# Patient Record
Sex: Female | Born: 1980 | Hispanic: No | Marital: Married | Smoking: Never smoker
Health system: Southern US, Community
[De-identification: ages and names within clinical notes are randomized; demographics above are authoritative.]

## PROBLEM LIST (undated history)

## (undated) DIAGNOSIS — Z789 Other specified health status: Secondary | ICD-10-CM

## (undated) HISTORY — PX: NO PAST SURGERIES: SHX2092

---

## 2017-08-11 LAB — OB RESULTS CONSOLE ABO/RH: RH Type: POSITIVE

## 2017-08-11 LAB — OB RESULTS CONSOLE HEPATITIS B SURFACE ANTIGEN: Hepatitis B Surface Ag: NEGATIVE

## 2017-08-11 LAB — OB RESULTS CONSOLE ANTIBODY SCREEN: ANTIBODY SCREEN: NEGATIVE

## 2017-08-11 LAB — OB RESULTS CONSOLE RPR: RPR: NONREACTIVE

## 2017-08-11 LAB — OB RESULTS CONSOLE GC/CHLAMYDIA
CHLAMYDIA, DNA PROBE: NEGATIVE
GC PROBE AMP, GENITAL: NEGATIVE

## 2017-08-11 LAB — OB RESULTS CONSOLE RUBELLA ANTIBODY, IGM: RUBELLA: IMMUNE

## 2017-08-11 LAB — OB RESULTS CONSOLE HIV ANTIBODY (ROUTINE TESTING): HIV: NONREACTIVE

## 2017-11-03 ENCOUNTER — Inpatient Hospital Stay (HOSPITAL_COMMUNITY): Admission: AD | Admit: 2017-11-03 | Payer: Self-pay | Source: Ambulatory Visit | Admitting: Obstetrics and Gynecology

## 2017-11-04 NOTE — L&D Delivery Note (Addendum)
VAGINAL DELIVERY NOTE  37 y.o. G1P0 at 3421w1d delivered a viable female infant at 0115 in cephalic, ROA position. No nuchal cord. Left anterior shoulder delivered with ease followed by 60 sec delayed cord clamping. Cord clamped x2 and cut. Placenta delivered spontaneously intact, with 3VC. Fundus firm on exam with massage and pitocin.  Mother: Anesthesia: epidural Laceration: 2nd perineal x1 Suture repair: 3.0 mono EBL: 200 mL  Baby: Apgars: 8, 9 Weight: pending Cord pH: not sent  Good hemostasis noted.  Mom to postpartum.  Baby to couplet-care/skin-to-skin.  [name], DO, PGY-2 02/07/2018, 2:32 AM   Angelica [name] is a 37 y.o. female G1P1001 with IUP at 7421w1d admitted for SOL.  She had AROM only for augmentation and progressed to complete and pushed 1 hour, then labored down, then pushed 1.5 hours to deliver.  Cord clamping delayed by 1-3 minutes then clamped by CNM and cut by FOB.  Placenta intact and spontaneous, bleeding minimal.  Dr Despina HiddenEure called to room to evaluate possible laceration of cervix but no cervical repair needed.  Second degree perineal laceration repaired without difficulty.  Mom and baby stable prior to transfer to postpartum. She plans on breastfeeding.

## 2017-12-29 LAB — OB RESULTS CONSOLE GC/CHLAMYDIA
Chlamydia: NEGATIVE
GC PROBE AMP, GENITAL: NEGATIVE

## 2017-12-29 LAB — OB RESULTS CONSOLE GBS: STREP GROUP B AG: NEGATIVE

## 2018-02-02 ENCOUNTER — Other Ambulatory Visit (HOSPITAL_COMMUNITY): Payer: Self-pay | Admitting: Nurse Practitioner

## 2018-02-02 ENCOUNTER — Telehealth (HOSPITAL_COMMUNITY): Payer: Self-pay | Admitting: *Deleted

## 2018-02-02 DIAGNOSIS — Z3A40 40 weeks gestation of pregnancy: Secondary | ICD-10-CM

## 2018-02-02 DIAGNOSIS — O480 Post-term pregnancy: Secondary | ICD-10-CM

## 2018-02-02 NOTE — Telephone Encounter (Signed)
Preadmission screen Interpreter number [phone]

## 2018-02-04 ENCOUNTER — Encounter (HOSPITAL_COMMUNITY): Payer: Self-pay | Admitting: *Deleted

## 2018-02-05 ENCOUNTER — Ambulatory Visit (HOSPITAL_COMMUNITY)
Admission: RE | Admit: 2018-02-05 | Discharge: 2018-02-05 | Disposition: A | Discharge status: Home / Self Care | Payer: BLUE CROSS/BLUE SHIELD | Source: Ambulatory Visit | Attending: Nurse Practitioner | Admitting: Nurse Practitioner

## 2018-02-05 DIAGNOSIS — O480 Post-term pregnancy: Secondary | ICD-10-CM | POA: Insufficient documentation

## 2018-02-05 DIAGNOSIS — Z3A40 40 weeks gestation of pregnancy: Secondary | ICD-10-CM

## 2018-02-06 ENCOUNTER — Other Ambulatory Visit: Payer: Self-pay

## 2018-02-06 ENCOUNTER — Encounter (HOSPITAL_COMMUNITY): Payer: Self-pay

## 2018-02-06 ENCOUNTER — Inpatient Hospital Stay (HOSPITAL_COMMUNITY): Payer: BLUE CROSS/BLUE SHIELD | Admitting: Anesthesiology

## 2018-02-06 ENCOUNTER — Inpatient Hospital Stay (HOSPITAL_COMMUNITY)
Admission: AD | Admit: 2018-02-06 | Discharge: 2018-02-09 | DRG: 807 | Disposition: A | Discharge status: Home / Self Care | Payer: BLUE CROSS/BLUE SHIELD | Source: Ambulatory Visit | Attending: Obstetrics & Gynecology | Admitting: Obstetrics & Gynecology

## 2018-02-06 DIAGNOSIS — K5900 Constipation, unspecified: Secondary | ICD-10-CM | POA: Diagnosis present

## 2018-02-06 DIAGNOSIS — O480 Post-term pregnancy: Secondary | ICD-10-CM | POA: Diagnosis not present

## 2018-02-06 DIAGNOSIS — Z3A41 41 weeks gestation of pregnancy: Secondary | ICD-10-CM

## 2018-02-06 DIAGNOSIS — Z3483 Encounter for supervision of other normal pregnancy, third trimester: Secondary | ICD-10-CM | POA: Diagnosis present

## 2018-02-06 HISTORY — DX: Other specified health status: Z78.9

## 2018-02-06 LAB — CBC
HEMATOCRIT: 38.2 % (ref 36.0–46.0)
HEMOGLOBIN: 12.8 g/dL (ref 12.0–15.0)
MCH: 26 pg (ref 26.0–34.0)
MCHC: 33.5 g/dL (ref 30.0–36.0)
MCV: 77.5 fL — ABNORMAL LOW (ref 78.0–100.0)
Platelets: 228 10*3/uL (ref 150–400)
RBC: 4.93 MIL/uL (ref 3.87–5.11)
RDW: 16.1 % — ABNORMAL HIGH (ref 11.5–15.5)
WBC: 13.6 10*3/uL — ABNORMAL HIGH (ref 4.0–10.5)

## 2018-02-06 LAB — TYPE AND SCREEN
ABO/RH(D): O POS
ANTIBODY SCREEN: NEGATIVE

## 2018-02-06 LAB — ABO/RH: ABO/RH(D): O POS

## 2018-02-06 MED ORDER — OXYTOCIN 40 UNITS IN LACTATED RINGERS INFUSION - SIMPLE MED
2.5000 [IU]/h | INTRAVENOUS | Status: DC
  Filled 2018-02-06: qty 1000

## 2018-02-06 MED ORDER — PHENYLEPHRINE 40 MCG/ML (10ML) SYRINGE FOR IV PUSH (FOR BLOOD PRESSURE SUPPORT)
80.0000 ug | PREFILLED_SYRINGE | INTRAVENOUS | Status: DC | PRN
  Filled 2018-02-06: qty 5

## 2018-02-06 MED ORDER — LIDOCAINE HCL (PF) 1 % IJ SOLN
30.0000 mL | INTRAMUSCULAR | Status: DC | PRN
  Filled 2018-02-06: qty 30

## 2018-02-06 MED ORDER — LIDOCAINE HCL (PF) 1 % IJ SOLN
INTRAMUSCULAR | Status: DC | PRN
  Administered 2018-02-06 (×2): 6 mL via EPIDURAL

## 2018-02-06 MED ORDER — OXYCODONE-ACETAMINOPHEN 5-325 MG PO TABS: 1.0000 | ORAL_TABLET | ORAL | Status: DC | PRN

## 2018-02-06 MED ORDER — LACTATED RINGERS IV SOLN
500.0000 mL | INTRAVENOUS | Status: DC | PRN
  Administered 2018-02-06 (×2): 500 mL via INTRAVENOUS

## 2018-02-06 MED ORDER — LACTATED RINGERS IV SOLN
500.0000 mL | Freq: Once | INTRAVENOUS | Status: AC
  Administered 2018-02-07: 500 mL via INTRAVENOUS

## 2018-02-06 MED ORDER — ONDANSETRON HCL 4 MG/2ML IJ SOLN: 4.0000 mg | Freq: Four times a day (QID) | INTRAMUSCULAR | Status: DC | PRN

## 2018-02-06 MED ORDER — ACETAMINOPHEN 325 MG PO TABS: 650.0000 mg | ORAL_TABLET | ORAL | Status: DC | PRN

## 2018-02-06 MED ORDER — OXYCODONE-ACETAMINOPHEN 5-325 MG PO TABS: 2.0000 | ORAL_TABLET | ORAL | Status: DC | PRN

## 2018-02-06 MED ORDER — EPHEDRINE 5 MG/ML INJ
10.0000 mg | INTRAVENOUS | Status: DC | PRN
  Filled 2018-02-06: qty 2

## 2018-02-06 MED ORDER — PHENYLEPHRINE 40 MCG/ML (10ML) SYRINGE FOR IV PUSH (FOR BLOOD PRESSURE SUPPORT)
80.0000 ug | PREFILLED_SYRINGE | INTRAVENOUS | Status: DC | PRN
  Filled 2018-02-06: qty 10
  Filled 2018-02-06: qty 5

## 2018-02-06 MED ORDER — FENTANYL CITRATE (PF) 100 MCG/2ML IJ SOLN
100.0000 ug | INTRAMUSCULAR | Status: DC | PRN
  Administered 2018-02-06 (×2): 100 ug via INTRAVENOUS
  Filled 2018-02-06 (×2): qty 2

## 2018-02-06 MED ORDER — FENTANYL 2.5 MCG/ML BUPIVACAINE 1/10 % EPIDURAL INFUSION (WH - ANES)
14.0000 mL/h | INTRAMUSCULAR | Status: DC | PRN
  Administered 2018-02-06 (×2): 14 mL/h via EPIDURAL
  Filled 2018-02-06 (×3): qty 100

## 2018-02-06 MED ORDER — LACTATED RINGERS IV SOLN
INTRAVENOUS | Status: DC
  Administered 2018-02-06 (×2): via INTRAVENOUS

## 2018-02-06 MED ORDER — DIPHENHYDRAMINE HCL 50 MG/ML IJ SOLN: 12.5000 mg | INTRAMUSCULAR | Status: DC | PRN

## 2018-02-06 MED ORDER — SOD CITRATE-CITRIC ACID 500-334 MG/5ML PO SOLN: 30.0000 mL | ORAL | Status: DC | PRN

## 2018-02-06 MED ORDER — FLEET ENEMA 7-19 GM/118ML RE ENEM: 1.0000 | ENEMA | RECTAL | Status: DC | PRN

## 2018-02-06 MED ORDER — OXYTOCIN BOLUS FROM INFUSION
500.0000 mL | Freq: Once | INTRAVENOUS | Status: AC
  Administered 2018-02-07: 500 mL via INTRAVENOUS

## 2018-02-06 NOTE — Progress Notes (Signed)
LABOR PROGRESS NOTE  Subjective:  Patient seen and examined for progress of labor. Patient comfortable with epidural. Feels the need to push.  Objective:  Vitals:   02/06/18 1901 02/06/18 2000 02/06/18 2030 02/06/18 2100  BP: 102/76 123/71 112/76 140/76  Pulse: 88 77 89 86  Resp: 16     Temp: 99 F (37.2 C) 98.8 F (37.1 C) 99.2 F (37.3 C)   TempSrc: Oral Oral Oral   SpO2:      Weight:      Height:       Dilation: 10 Dilation Complete Date: 02/06/18 Dilation Complete Time: 2055 Effacement (%): 100 Cervical Position: Anterior Station: Plus 1 Presentation: Vertex Exam by:: Veronica Mensah FHT: 150 bpm, moderate variability, accelerations present, absent decelerations TOCO: regular, every 2-3 minutes  Assessment/Plan: Angelica [name] is a 37 y.o. G1P0 at 7652w0d presented for SOL. S/p AROM @1719 . Making regular contractions with desire to push.   Labor: stage 2 Preeclampsia: N/A Fetal wellbeing: category 1 Pain control: epidural I/D: neg Anticipated MOD: continue expectant management, anticipate SVD  Durward Parcelavid Rolin Schult, DO, PGY-2 02/06/2018, 10:09 PM

## 2018-02-06 NOTE — Anesthesia Pain Management Evaluation Note (Signed)
  CRNA Pain Management Visit Note  Patient: Angelica [name], 37 y.o., female  "Hello I am a member of the anesthesia team at [facility]. We have an anesthesia team available at all times to provide care throughout the hospital, including epidural management and anesthesia for C-section. I don't know your plan for the delivery whether it a natural birth, water birth, IV sedation, nitrous supplementation, doula or epidural, but we want to meet your pain goals."   1.Was your pain managed to your expectations on prior hospitalizations?   No prior hospitalizations  2.What is your expectation for pain management during this hospitalization?     Labor support without medications  3.How can we help you reach that goal? Support prn  Record the patient's initial score and the patient's pain goal.   Pain: 3  Pain Goal: 9   Interview conducted by bedside RN via language line.  RN reported results of interview to CRNA.  [facility] wants you to be able to say your pain was always managed very well.  [facility] Center[facility]LE,Angelica [name] 02/06/2018

## 2018-02-06 NOTE — H&P (Signed)
OBSTETRIC ADMISSION HISTORY AND PHYSICAL  Angelica [name] is a 37 y.o. female G1P0 with IUP at [redacted]w[redacted]d by LMP presenting for spontaneous onset of labor. She reports +FMs, No LOF, no VB, no blurry vision, headaches or peripheral edema, and RUQ pain.  She plans on breast feeding. She is unsure what she desires for birth control. She received her prenatal care at [facility]   Dating: By LMP --->  Estimated Date of Delivery: 01/30/18  Prenatal History/Complications:  Past Medical History: History reviewed. No pertinent past medical history.  Past Surgical History: History reviewed. No pertinent surgical history.  Obstetrical History: OB History    Gravida  1   Para      Term      Preterm      AB      Living  0     SAB      TAB      Ectopic      Multiple      Live Births              Social History: Social History   Socioeconomic History  . Marital status: Married    Spouse name: Not on file  . Number of children: Not on file  . Years of education: Not on file  . Highest education level: Not on file  Occupational History  . Not on file  Social Needs  . Financial resource strain: Not on file  . Food insecurity:    Worry: Not on file    Inability: Not on file  . Transportation needs:    Medical: Not on file    Non-medical: Not on file  Tobacco Use  . Smoking status: Never Smoker  . Smokeless tobacco: Never Used  Substance and Sexual Activity  . Alcohol use: Never    Frequency: Never  . Drug use: Never  . Sexual activity: Not on file  Lifestyle  . Physical activity:    Days per week: Not on file    Minutes per session: Not on file  . Stress: Not on file  Relationships  . Social connections:    Talks on phone: Not on file    Gets together: Not on file    Attends religious service: Not on file    Active member of club or organization: Not on file    Attends meetings of clubs or organizations: Not on file    Relationship status: Not on file  Other Topics  Concern  . Not on file  Social History Narrative  . Not on file    Family History: History reviewed. No pertinent family history.  Allergies: No Known Allergies  Medications Prior to Admission  Medication Sig Dispense Refill Last Dose  . Prenatal Vit-Fe Fumarate-FA (PRENATAL VITAMIN PO) Take by mouth.      Review of Systems   All systems reviewed and negative except as stated in HPI  Blood pressure 118/81, pulse 94, temperature 98.5 F (36.9 C), temperature source Oral, resp. rate 16, weight 131 lb 12 oz (59.8 kg), SpO2 98 %. General appearance: alert, cooperative and no distress Lungs: clear to auscultation bilaterally Heart: regular rate and rhythm Abdomen: soft, non-tender; bowel sounds normal Pelvic: n/a Extremities: Homans sign is negative, no sign of DVT DTR's +2 Presentation: cephalic Fetal monitoringBaseline: 135 bpm, Variability: Good {> 6 bpm), Accelerations: Non-reactive but appropriate for gestational age and Decelerations: Early Uterine activityFrequency: Every 3-4 minutes Dilation: 5 Effacement (%): 100 Station: -2 Exam by:: jolynn  Prenatal labs:  ABO, Rh: O/Positive/-- (10/08 0000) Antibody: Negative (10/08 0000) Rubella: Immune (10/08 0000) RPR: Nonreactive (10/08 0000)  HBsAg: Negative (10/08 0000)  HIV: Non-reactive (10/08 0000)  GBS: Negative (02/25 0000)   Prenatal Transfer Tool  Maternal Diabetes: No Genetic Screening: Normal Maternal Ultrasounds/Referrals: Normal Fetal Ultrasounds or other Referrals:  None Maternal Substance Abuse:  No Significant Maternal Medications:  None Significant Maternal Lab Results: Lab values include: Group B Strep negative  No results found for this or any previous visit (from the past 24 hour(s)).  There are no active problems to display for this patient.   Assessment/Plan:  [name] is a 37 y.o. G1P0 at 4944w0d here for spontaneous onset of labor  #Labor: Expectant management #Pain: Per patient  request #FWB: Cat 1 #ID:  GBS neg #MOF: Breast #MOC: unsure #Circ:  outpatient  Rolm Bookbinderaroline M Neill, CNM  02/06/2018, 10:49 AM

## 2018-02-06 NOTE — Anesthesia Preprocedure Evaluation (Signed)
Anesthesia Evaluation  Patient identified by MRN, date of birth, ID band Patient awake    Reviewed: Allergy & Precautions, NPO status , Patient's Chart, lab work & pertinent test results  Airway Mallampati: I       Dental no notable dental hx. (+) Teeth Intact   Pulmonary neg pulmonary ROS,    Pulmonary exam normal breath sounds clear to auscultation       Cardiovascular negative cardio ROS Normal cardiovascular exam Rhythm:Regular Rate:Normal     Neuro/Psych negative neurological ROS  negative psych ROS   GI/Hepatic negative GI ROS, Neg liver ROS,   Endo/Other  negative endocrine ROS  Renal/GU negative Renal ROS  negative genitourinary   Musculoskeletal negative musculoskeletal ROS (+)   Abdominal Normal abdominal exam  (+)   Peds  Hematology negative hematology ROS (+)   Anesthesia Other Findings   Reproductive/Obstetrics negative OB ROS                             Anesthesia Physical Anesthesia Plan  ASA: II  Anesthesia Plan: Epidural   Post-op Pain Management:    Induction:   PONV Risk Score and Plan:   Airway Management Planned:   Additional Equipment:   Intra-op Plan:   Post-operative Plan:   Informed Consent: I have reviewed the patients History and Physical, chart, labs and discussed the procedure including the risks, benefits and alternatives for the proposed anesthesia with the patient or authorized representative who has indicated his/her understanding and acceptance.     Plan Discussed with:   Anesthesia Plan Comments:         Anesthesia Quick Evaluation

## 2018-02-06 NOTE — MAU Note (Signed)
Started having pains after visit yesterday, coming about every 15 min, not getting any stronger.little bit of blood.  For induction tomorrow

## 2018-02-06 NOTE — Anesthesia Procedure Notes (Signed)
Epidural Patient location during procedure: OB Start time: 02/06/2018 4:31 PM End time: 02/06/2018 4:35 PM  Staffing Anesthesiologist: Leilani AbleHatchett, Brytani Voth, MD Performed: anesthesiologist   Preanesthetic Checklist Completed: patient identified, site marked, surgical consent, pre-op evaluation, timeout performed, IV checked, risks and benefits discussed and monitors and equipment checked  Epidural Patient position: sitting Prep: site prepped and draped and DuraPrep Patient monitoring: continuous pulse ox and blood pressure Approach: midline Location: L3-L4 Injection technique: LOR air  Needle:  Needle type: Tuohy  Needle gauge: 17 G Needle length: 9 cm and 9 Needle insertion depth: 5 cm cm Catheter type: closed end flexible Catheter size: 19 Gauge Catheter at skin depth: 10 cm Test dose: negative and Other  Assessment Sensory level: T9 Events: blood not aspirated, injection not painful, no injection resistance, negative IV test and no paresthesia  Additional Notes Reason for block:procedure for pain

## 2018-02-06 NOTE — Progress Notes (Signed)
Labor Progress Note Angelica [name] is a 37 y.o. G1P0 at 6486w0d presented for spontaneous onset of labor  S:  Patient comfortable with epidural  O:  BP 116/69   Pulse 71   Temp 97.9 F (36.6 C) (Oral)   Resp 16   Ht 5' (1.524 m)   Wt 131 lb 12 oz (59.8 kg)   SpO2 98%   BMI 25.73 kg/m   Fetal Tracing:  Baseline: 145 Variability: moderate Accels: none Decels: none  Toco: 2-3  CVE: Dilation: 6.5 Effacement (%): 90 Cervical Position: Middle Station: -1 Presentation: Vertex Exam by:: Lima, rn   A&P: 37 y.o. G1P0 5586w0d spontaneous onset of labor #Labor: discussed with patient AROM for labor augmentation. Patient agreeable to plan of care. AROM with moderate amount of meconium stained fluid.  #Pain: epidural #FWB: Cat 1 #GBS negative  Rolm Bookbinderaroline M Belem Hintze, CNM 5:23 PM

## 2018-02-07 ENCOUNTER — Inpatient Hospital Stay (HOSPITAL_COMMUNITY): Admission: RE | Admit: 2018-02-07 | Payer: BLUE CROSS/BLUE SHIELD | Source: Ambulatory Visit

## 2018-02-07 ENCOUNTER — Encounter (HOSPITAL_COMMUNITY): Payer: Self-pay | Admitting: General Practice

## 2018-02-07 DIAGNOSIS — O480 Post-term pregnancy: Secondary | ICD-10-CM

## 2018-02-07 DIAGNOSIS — Z3A41 41 weeks gestation of pregnancy: Secondary | ICD-10-CM

## 2018-02-07 MED ORDER — SENNOSIDES-DOCUSATE SODIUM 8.6-50 MG PO TABS
2.0000 | ORAL_TABLET | ORAL | Status: DC
  Administered 2018-02-08 (×2): 2 via ORAL
  Filled 2018-02-07 (×2): qty 2

## 2018-02-07 MED ORDER — ACETAMINOPHEN 325 MG PO TABS
650.0000 mg | ORAL_TABLET | ORAL | Status: DC | PRN
  Administered 2018-02-09: 650 mg via ORAL
  Filled 2018-02-07: qty 2

## 2018-02-07 MED ORDER — WITCH HAZEL-GLYCERIN EX PADS: 1.0000 "application " | MEDICATED_PAD | CUTANEOUS | Status: DC | PRN

## 2018-02-07 MED ORDER — ONDANSETRON HCL 4 MG PO TABS: 4.0000 mg | ORAL_TABLET | ORAL | Status: DC | PRN

## 2018-02-07 MED ORDER — SIMETHICONE 80 MG PO CHEW
80.0000 mg | CHEWABLE_TABLET | ORAL | Status: DC | PRN
Start: 2018-02-07 — End: 2018-02-09

## 2018-02-07 MED ORDER — ONDANSETRON HCL 4 MG/2ML IJ SOLN: 4.0000 mg | INTRAMUSCULAR | Status: DC | PRN

## 2018-02-07 MED ORDER — IBUPROFEN 600 MG PO TABS
600.0000 mg | ORAL_TABLET | Freq: Four times a day (QID) | ORAL | Status: DC
  Administered 2018-02-07 – 2018-02-09 (×10): 600 mg via ORAL
  Filled 2018-02-07 (×11): qty 1

## 2018-02-07 MED ORDER — BENZOCAINE-MENTHOL 20-0.5 % EX AERO
1.0000 "application " | INHALATION_SPRAY | CUTANEOUS | Status: DC | PRN
  Administered 2018-02-07: 1 via TOPICAL
  Filled 2018-02-07: qty 56

## 2018-02-07 MED ORDER — DIBUCAINE 1 % RE OINT: 1.0000 "application " | TOPICAL_OINTMENT | RECTAL | Status: DC | PRN

## 2018-02-07 MED ORDER — TETANUS-DIPHTH-ACELL PERTUSSIS 5-2.5-18.5 LF-MCG/0.5 IM SUSP: 0.5000 mL | Freq: Once | INTRAMUSCULAR | Status: DC

## 2018-02-07 MED ORDER — PRENATAL MULTIVITAMIN CH
1.0000 | ORAL_TABLET | Freq: Every day | ORAL | Status: DC
  Administered 2018-02-07 – 2018-02-09 (×3): 1 via ORAL
  Filled 2018-02-07 (×3): qty 1

## 2018-02-07 MED ORDER — COCONUT OIL OIL: 1.0000 "application " | TOPICAL_OIL | Status: DC | PRN

## 2018-02-07 MED ORDER — DIPHENHYDRAMINE HCL 25 MG PO CAPS: 25.0000 mg | ORAL_CAPSULE | Freq: Four times a day (QID) | ORAL | Status: DC | PRN

## 2018-02-07 MED ORDER — LIDOCAINE-EPINEPHRINE (PF) 2 %-1:200000 IJ SOLN
INTRAMUSCULAR | Status: DC | PRN
  Administered 2018-02-07: 5 mL via EPIDURAL

## 2018-02-07 MED ORDER — ZOLPIDEM TARTRATE 5 MG PO TABS: 5.0000 mg | ORAL_TABLET | Freq: Every evening | ORAL | Status: DC | PRN

## 2018-02-07 NOTE — Lactation Note (Signed)
This note was copied from a baby's chart. Lactation Consultation Note [employer] Interpreter used mary [phone]#[id number]. Mom has slight different dialect  From interpreter and will ask to repeat if doesn't understand.  Baby 2 hrs old. Unable to latch d/t inverted non-compressible breast. Round breast tight. Breast massage and reverse pressure around areola helpful. Hand expression taught. Needed several minutes of hand expression to see thick white colostrum in center of inverted nipple. Unable to roll nipple w/finger tips. Hand pump to evert nipple and define boards.  Shells given to wear in bra this morning.  Baby isn't able to latch w/o NS. Fitted #16 NS.   Mom needed full assistance in BF. Baby BF well for 5 minutes. Noted slight dimpling. Encouraged mom to keep baby cheeks to breast. Mom needed assistance doing this. Support placed under hand and baby's head.  FOB at bedside teaching as well. Demonstrated burping, body alignment, positioning, and charting I&O. Mom has long natural finger nails, stressed importance of cutting them. Demonstrated unlatching. Newborn feeding habits, I&O, STS, cluster feeding, supply and demand discussed.Mom encouraged to feed baby 8-12 times/24 hours and with feeding cues.   Mom encouraged to waken baby for feeds if hasn't cued in 3 hrs. Mom and FOB attentive. Encouraged to call for assitance or questions. WH/LC brochure given w/resources, support groups and LC services.  Patient Name: Boy Alyona Hartis Today's Date: 02/07/2018 Reason for consult: Initial assessment   Maternal Data Has patient been taught Hand Expression?: Yes Does the patient have breastfeeding experience prior to this delivery?: No  Feeding Feeding Type: Breast Fed Length of feed: 5 min  LATCH Score Latch: Repeated attempts needed to sustain latch, nipple held in mouth throughout feeding, stimulation needed to elicit sucking reflex.  Audible Swallowing: A few with stimulation  Type of Nipple:  Inverted  Comfort (Breast/Nipple): Soft / non-tender  Hold (Positioning): Full assist, staff holds infant at breast  LATCH Score: 4  Interventions Interventions: Breast feeding basics reviewed;Support pillows;Assisted with latch;Position options;Skin to skin;Expressed milk;Breast massage;Hand express;Shells;Pre-pump if needed;Hand pump;Reverse pressure;Breast compression;Adjust position  Lactation Tools Discussed/Used Tools: Shells;Pump;Nipple Shields Nipple shield size: 16 Shell Type: Inverted Breast pump type: Manual WIC Program: Yes Pump Review: Setup, frequency, and cleaning;Milk Storage Initiated by:: Peri JeffersonL. Karlynn Furrow RN IBCLC Date initiated:: 02/07/18   Consult Status Consult Status: Follow-up Date: 02/07/18 Follow-up type: In-patient    Charyl DancerCARVER, Ly Wass G 02/07/2018, 3:44 AM

## 2018-02-07 NOTE — Anesthesia Postprocedure Evaluation (Signed)
Anesthesia Post Note  Patient: Angelica [name]  Procedure(s) Performed: AN AD HOC LABOR EPIDURAL     Patient location during evaluation: Mother Baby Anesthesia Type: Epidural Level of consciousness: awake and alert Pain management: pain level controlled Vital Signs Assessment: post-procedure vital signs reviewed and stable Respiratory status: spontaneous breathing, nonlabored ventilation and respiratory function stable Cardiovascular status: stable Postop Assessment: no headache, no backache and epidural receding Anesthetic complications: no    Last Vitals:  Vitals:   02/07/18 0425 02/07/18 0539  BP: 118/70 112/71  Pulse: 66 67  Resp: 18 18  Temp: 37.2 C 37.2 C  SpO2: 99%     Last Pain:  Vitals:   02/07/18 0541  TempSrc:   PainSc: 4    Pain Goal: Patients Stated Pain Goal: 2 (02/06/18 2045)               Angelica [name],Angelica [name]

## 2018-02-07 NOTE — Consult Note (Signed)
[facility]  Delivery Note:  SVD    02/07/2018  1:15 AM  I was called to the delivery room at the [facility] of the patient's obstetrician (Dr. McMullen) for MSAF and absent variability.  PRENATAL HX:  This is a 36 y/o G1P0 at 41 and 1/[redacted] weeks gestation who was admitted yesterday in spontaneous labor.  She is GBS negative with AROM 8 hours.    DELIVERY:  Cord clamping delayed ~ 2 minutes.  Infant was vigorous at delivery, requiring no resuscitation other than standard warming, drying and stimulation.  A pulse oximeter was applied and O2 saturations in mid 90s by 5 minutes.  APGARs 8 and 9.  Exam within normal limits.  After 5 minutes, baby left with nurse to assist parents with skin-to-skin care.   _____________________ Electronically Signed By: Lenise Jr, MD Neonatologist  

## 2018-02-08 LAB — RPR: RPR Ser Ql: REACTIVE — AB

## 2018-02-08 LAB — RPR, QUANT+TP ABS (REFLEX): T Pallidum Abs: NEGATIVE

## 2018-02-08 MED ORDER — POLYETHYLENE GLYCOL 3350 17 G PO PACK
17.0000 g | PACK | Freq: Every day | ORAL | Status: DC
  Administered 2018-02-09: 17 g via ORAL
  Filled 2018-02-08 (×3): qty 1

## 2018-02-08 NOTE — Progress Notes (Signed)
POSTPARTUM PROGRESS NOTE  Post Partum Day 1  Subjective:  Angelica [name] is a 37 y.o. G1P1001 s/p SVD at 2625w1d.  No acute events overnight.  Pt denies problems with ambulating, voiding or po intake.  She denies nausea or vomiting.  Pain is well controlled.  Reports difficulty having BM. Denies any other concerns. Bleeding is controlled.   Objective: Blood pressure 94/71, pulse 74, temperature 98.7 F (37.1 C), temperature source Oral, resp. rate 18, height 5' (1.524 m), weight 131 lb 12 oz (59.8 kg), SpO2 96 %, unknown if currently breastfeeding.  Physical Exam:  General: alert, cooperative and no distress Chest: no respiratory distress Heart:regular rate, distal pulses intact Abdomen: soft, nontender,  Uterine Fundus: firm, appropriately tender DVT Evaluation: No calf swelling or tenderness Extremities: no  edema Skin: warm, dry  Recent Labs    02/06/18 1059  HGB 12.8  HCT 38.2    Assessment/Plan: Angelica [name] is a 37 y.o. G1P1001 s/p SVD at 6425w1d   PPD#1: Routine pp care Constipation: Miralax daily Contraception: NFP Feeding: breast Dispo: Plan for discharge tomorrow.   LOS: 2 days   Kandra NicolasJulie P DegeleMD 02/08/2018, 8:55 AM

## 2018-02-08 NOTE — Progress Notes (Signed)
CSW used Secretary/administratorLanguage Line Interpreter Services for this call. Interpreter ID # [phone], Evette CristalShin assisted CSW with contacting patient. CSW received consult for a [place] speaking patient, a dialect of Vietmanese. CSW and interpreter attempted to reach patient on her personal cell phone but did not receive an answer. CSW and interpreter spoke with patient via hospital room phone. CSW inquired about patient's wellbeing since delivery, patient is hurting whenever she attempts to move. CSW inquired if patient had spoken to her nurse regarding pain, stated yes but had not been given anything today. CSW inquired about car seat and crib for infant, patient reports having both in place. Patient reports that she is formula feeding. CSW educated [facility] about [facility] and how to access that benefit. CSW will speak with RN to get patient that information. This is the patient's first baby, stating great support from father of the baby. Patient states she has a safe living environment. Patient reported having a great mood since delivery, very happy. Patient reports being severely constipated since giving birth, CSW will speak with RN to communicate needs. CSW encouraged patient to reach out for assistance with her nurses or other staff if necessary, expressed agreement and gratitude.   CSW spoke with Everardo AllSarah Riffey, RN to make her aware of patient needs.  Edwin Dadaarol Danika Kluender, MSW, LCSW-A Clinical Social Worker [facility] [phone]

## 2018-02-09 MED ORDER — IBUPROFEN 600 MG PO TABS: 600.0000 mg | ORAL_TABLET | Freq: Four times a day (QID) | ORAL | 0 refills | Status: AC

## 2018-02-09 NOTE — Lactation Note (Signed)
This note was copied from a baby's chart. Lactation Consultation Note FOB translated. Reported baby poor feeder, was on TPT and has been d/c'd.  Mom engorged. Pump w/DEBP and icing. LC massaged breast, demonstrated to FOB to massage breast while mom pumping. Mom pumped 30ml has been pumping 50 min. Instructed to pump only 20 min. Laid mom fla in bed w/cabbage leaves to breast w/burp cloth over breast ice for 20 min. To rotate around breast in 5 min. Increments. Mom is to cont. To wear cabbage until cabbage very soft w/milk. Mom is to rest then pump in 2 hrs to soften, then BF baby.  FOB giving BM in personal bottle. Encouraged not to supplement w/formula since mom has all of this BM. Need to put the baby to breast, then post-pump to soften breast. Encouraged to massage while BF at intervals and while pumping. ICE if needed. Re-apply cabbage after BF if still engorged.    Patient Name: Angelica [name]: 02/09/2018 Reason for consult: Follow-up assessment;Engorgement   Maternal Data    Feeding Feeding Type: Breast Milk Nipple Type: Slow - flow  LATCH Score          Comfort (Breast/Nipple): Engorged, cracked, bleeding, large blisters, severe discomfort        Interventions Interventions: DEBP;Ice;Breast massage  Lactation Tools Discussed/Used Breast pump type: Double-Electric Breast Pump   Consult Status Consult Status: Follow-up [name]: 02/09/18 Follow-up type: In-patient    Taeshawn Helfman, Diamond NickelLAURA G 02/09/2018, 1:18 AM

## 2018-02-09 NOTE — Progress Notes (Signed)
[employer] interpreter [phone] is used to review infant and maternal care and safety and maternal discharge teaching, when to call the doctor, medications and to make a follow up app't. Patient and family state understanding information.

## 2018-02-09 NOTE — Discharge Summary (Signed)
OB Discharge Summary     Patient Name: Angelica [name] DOB: [birth year] MRN: [id number]  Date of admission: 02/06/2018 Delivering MD: Wendee Beavers   Date of discharge: 02/09/2018  Admitting diagnosis: 41WKS, PAINS Intrauterine pregnancy: [redacted]w[redacted]d     Secondary diagnosis:  Active Problems:   Normal labor and delivery  Additional problems: language barrier     Discharge diagnosis: Term Pregnancy Delivered                                                                                                Post partum procedures:n/a  Augmentation: AROM  Complications: None  Hospital course:  Onset of Labor With Vaginal Delivery     37 y.o. yo G1P1001 at [redacted]w[redacted]d was admitted in Active Labor on 02/06/2018. Patient had an uncomplicated labor course as follows:  Membrane Rupture Time/Date: 5:19 PM ,02/06/2018   Intrapartum Procedures: Episiotomy:                                           Lacerations:  2nd degree [3];Perineal [11]  Patient had a delivery of a Viable infant. 02/07/2018  Information for the patient's newborn:  Anouk, Critzer [[id number]]  Delivery Method: Vaginal, Spontaneous(Filed from Delivery Summary)    Pateint had an uncomplicated postpartum course.  She is ambulating, tolerating a regular diet, passing flatus, and urinating well. Patient is discharged home in stable condition on 02/10/18.   Physical exam  Vitals:   02/07/18 1822 02/08/18 0626 02/08/18 1850 02/09/18 0612  BP: 116/66 94/71 111/70   Pulse: 83 74 80 87  Resp: 16 18 16    Temp: 98.9 F (37.2 C) 98.7 F (37.1 C) 98.5 F (36.9 C) 98.9 F (37.2 C)  TempSrc: Oral Oral Oral Oral  SpO2:  96%  97%  Weight:      Height:       General: alert, cooperative and no distress Lochia: appropriate Uterine Fundus: firm Incision: Healing well with no significant drainage, No significant erythema DVT Evaluation: No evidence of DVT seen on physical exam. Negative Homan's sign. No cords or calf tenderness. No significant  calf/ankle edema. Labs: Lab Results  Component Value Date   WBC 13.6 (H) 02/06/2018   HGB 12.8 02/06/2018   HCT 38.2 02/06/2018   MCV 77.5 (L) 02/06/2018   PLT 228 02/06/2018   No flowsheet data found.  Discharge instruction: per After Visit Summary and "Baby and Me Booklet".  After visit meds:  Allergies as of 02/09/2018   No Known Allergies     Medication List    TAKE these medications   ibuprofen 600 MG tablet Commonly known as:  ADVIL,MOTRIN Take 1 tablet (600 mg total) by mouth every 6 (six) hours.   PRENATAL VITAMIN PO Take by mouth.       Diet: routine diet  Activity: Advance as tolerated. Pelvic rest for 6 weeks.   Outpatient follow up:6 weeks Follow up Appt:No future appointments. Follow up Visit:No follow-ups on file.  Postpartum contraception: None  Newborn Data: Live born female  Birth Weight: 7 lb 3.7 oz (3280 g) APGAR: 8, 9  Newborn Delivery   Birth date/time:  02/07/2018 01:15:00 Delivery type:  Vaginal, Spontaneous     Baby Feeding: Breast Disposition:home with mother   02/09/2018 Donette LarryMelanie Nabilah Davoli, CNM

## 2018-02-10 ENCOUNTER — Ambulatory Visit: Payer: Self-pay

## 2018-02-10 NOTE — Lactation Note (Signed)
This note was copied from a baby's chart. Lactation Consultation Note Baby 4671 hrs old. Used [employer] interpreter [phone]#[id number] Kenney Housemananya. Noted in I&O charting baby had been getting formula all day. Mom states she is giving BM after she pumps. Mom stated that she gives formula to see how much the baby gets. Encouraged mom to feel her breast when baby starts to BF and feel her breast when the baby finishes to assess for transfer. Mom states breast is softer after BF. Mom using DEBP when LC entered rm. Baby sleeping. Mom pumped 45 ml. Breast softer mom stated but cont. To have some knots and tightness. Encouraged mom to rest flat w/ice then pump again in an hour, massage breast as pumping. BF baby for next feeding, supplement after feeding if needed. Baby shouldn't need to be supplemented after BF if getting transfer.  Language barrier, trying to figure out if mom is pumping and giving BM and how much. Encouraged mom to BF every time baby wants to, but at least every 2-3 hrs. Mom needs to get engorgement under control. LC stresses importance of emptying ( softening) breast. Reviewed milk storage again.  Mom has #16 NS. Nipples increased d/t pumping. LC gave #20NS. Mom stated she has seen milk in NS. Mom was pumping into smaller bottles, encouraged to use bigger bottles for next pumping. Encouraged to document how much BM given. Encouraged no more formula.  Patient Name: Boy Royetta CarHnga Schenk EAVWU'JToday's Date: 02/10/2018 Reason for consult: Follow-up assessment;Engorgement   Maternal Data    Feeding    LATCH Score       Type of Nipple: Everted at rest and after stimulation  Comfort (Breast/Nipple): Engorged, cracked, bleeding, large blisters, severe discomfort        Interventions Interventions: Breast massage;Breast compression;Ice;DEBP  Lactation Tools Discussed/Used Tools: Pump;Nipple Shields Nipple shield size: 20 Shell Type: Inverted Breast pump type: Double-Electric Breast Pump Pump Review: Milk  Storage   Consult Status Consult Status: Follow-up Date: 02/10/18 Follow-up type: In-patient    Charyl DancerCARVER, Raphael Fitzpatrick G 02/10/2018, 12:31 AM

## 2018-02-10 NOTE — Lactation Note (Signed)
This note was copied from a baby's chart. Lactation Consultation Note  Patient Name: Angelica [name] Today's Date: 02/10/2018   Multiple attempts were made to obtain an interpreter that spoke [place] or [place] but were unsuccessful. Although Dad is not comfortable with [place], he said Mom could speak it better than him. [place] interpreter, Mardene Celeste, used with mobile video interpreter.   Mom mildly engorged, I assisted Mom in pumping to soften breasts some. Size 24 flanges are appropriate for this Mom at this time. Mom still uses a nipple shield when putting infant at breast. I was unable to observe feeding b/c infant had recently been fed a bottle of EBM (Mom had said she did not want to put baby at breast at that time). Mom is interested in having an outpatient lactation appt. I sent a request for lactation appt to be made.   Considerable education done via interpreter. Mom was also placed in shells and encouraged to wear them to promote leaking/prevent engorgement.    Dad had bought a Medela DEBP at home. Mom was also shown how to assemble & use hand pump that was included in pump kit.   Matthias Hughs [facility] 02/10/2018, 4:07 PM

## 2018-02-24 ENCOUNTER — Ambulatory Visit: Payer: Self-pay

## 2018-02-24 NOTE — Lactation Note (Signed)
This note was copied from a baby's chart. 02/24/2018  Name: Angelica [name] MRN: [id number] Date of Birth: 02/07/2016 Gestational Age: Gestational Age: 735w1d Birth Weight: 115.7 oz Weight today:    8 pounds 2.4 ounces (3698 grams) with clean newborn diaper  Angelica [name] has gained 168 grams in the last 5 days with an average daily weight gain of 34 grams a day.  Infant is here today with mom and dad. Interpreter [employer] Interpreter, Donavan BurnetWeir Siu here for consult.   Mom reports she is BF infant on demand about every 2-3 hours. She uses both breasts with each feeding. She is not sure how long infant is feeding. Parents are keeping feeding log. Infant is voiding and stooling well with yellow stools.   Mom is pumping 3-4 x a day with a Medela pump. She is unsure of how much she is pumping. She has some in the refrigerator. Mom reports her left breast softens with pumping, her right breast does not. She is using warm compresses. Enc mom to use ice for 24 hours to the right breast before pumping/feeding to help milk flow and then warm compresses after. Enc warm shower and massage to get milk flowing also.  She reports when she does not feel well, she pumps and dumps so infant is not exposed to germs. Discussed the importance of feeding infant that breast milk to form antibodies against any germs/illnesses.   Parents are giving some bottles using the Avent bottle. They will give an occasional bottle of formula if needed. Mom did experience some bleeding from the right side in the last few days, she reports she has not been pumping that side as much and not feeding infant on it. Nipple tissue is intact, could be rusty pipe syndrome. Discussed importance of feeding and/or pumping on that side to maintain milk supply. She reports her supply decreased on the right side in the last few days. Mom is using the # 20 NS with every feeding. She has not tried infant without the NS.   Mom latched infant to the left  breast in the cross cradle hold after independently placing NS. Infant fed off and on for about 15 minutes and transferred 2 ml. Discussed importance of keeping infant awake and the breast and breast massage/compression with feeding. When infant came off the breast, there was a small amount of blood in the NS, there was a small blister on the tip of the nipple. Bleeding very scant. Mom reports pain with feeding. Nipple care discussed with mom .  Mom's breasts are firm today. We did try to latch infant without the nipple shield and he was not able to latch without it. Enc mom to try once a day to see if he can latch without it.    Changed mom to # 24 NS and reiterated importance of deep latch and keeping infant stimulated during the feeding. Mom did better with keeping him awake, although infant still sleepy. Mom reports pain with initial latch that improved with feeding, she did feel a tug. Infant weighed to awaken and he had transferred 24 ml. Infant relatched and mom Enc to keep infant awake and to massage/compress breast with feeding. Had to encourage mom to keep infant close in on the breast. Mom reports this is a typical feeding for the infant. Infant transferred a total of 28 ml and was still cueing when he came off the breast, feeding finished with a bottle, infant not very interested in feeding on the  bottle. Parents to feed again at home.   Infant to return for follow up Ped appt on May 19. Infant to follow up with Lactation in 1 week. Parents report all questions/concerns have been answered at this time. AVS was printed for parents to review with interpreter before leaving office. Dad voiced understanding.   General Information: Mother's reason for visit: Having pain with latch, NS use Consult: Initial Lactation consultant: Noralee Stain RN,IBCLC Breastfeeding experience: Sometimes breastfeeds, sometimes bottle feeds   Maternal medications: Pre-natal vitamin  Breastfeeding History: Frequency  of breast feeding: every 2-3 hours Duration of feeding: mom is not sure, she says she does not pay attention  Supplementation: Supplement method: bottle(Avent) Brand: Daron Offer Formula volume: 40-50 ml  Formula frequency: every few days   Breast milk volume: 40-50 ml Breast milk frequency: 1-2 x a day   Pump type: Medela pump in style Pump frequency: 3-4 x a day Pump volume: mom is unsure  Infant Output Assessment: Voids per 24 hours: 5 Urine color: Clear yellow Stools per 24 hours: 5 Stool color: Yellow  Breast Assessment: Breast: Full, Non-compressible, Engorged Nipple: Erect(short shaft) Pain level: 1 Pain interventions: Bra  Feeding Assessment: Infant oral assessment: WNL   Positioning: Cross cradle(left breast) Latch: 1 - Repeated attempts needed to sustain latch, nipple held in mouth throughout feeding, stimulation needed to elicit sucking reflex. Audible swallowing: 1 - A few with stimulation Type of nipple: 2 - Everted at rest and after stimulation Comfort: 1 - Filling, red/small blisters or bruises, mild/mod discomfort Hold: 1 - Assistance needed to correctly position infant at breast and maintain latch LATCH score: 6 Latch assessment: Shallow Lips flanged: Yes Suck assessment: Displays both Tools: Nipple shield 20 mm Pre-feed weight: 3698 grams Post feed weight: 3700 grams Amount transferred: 2 Amount supplemented: 0  Additional Feeding Assessment: Infant oral assessment: WNL   Positioning: Cross cradle(right breast) Latch: 1 - Repeated attempts neede to sustain latch, nipple held in mouth throughout feeding, stimulation needed to elicit sucking reflex. Audible swallowing: 1 - A few with stimulation Type of nipple: 2 - Everted at rest and after stimulation Comfort: 2 - Soft/non-tender Hold: 1 - Assistance needed to correctly position infant at breast and maintain latch LATCH score: 7 Latch assessment: Shallow(asked mom to pull infant  deeper) Lips flanged: Yes Suck assessment: Displays both Tools: Nipple shield 24 mm Pre-feed weight: 3700 grams Post feed weight: 3724 grams after 20 minutes, relatched and then transferred another 6 ml Amount transferred: 26 ml Amount supplemented: 0  Totals: Total amount transferred: 28 ml Total supplement given: 0   Plan:  1. Offer the breast with feeding cues 2. Use the # 24 nipple shield with feedings 3. Keep infant awake at the breast as needed 4. Massage/compress breast with feedings 5. Empty one breast before giving second breast 6. If right breast is not softening, can use ice packs to breast for 10-20 minutes before pumping or feeding for the next 24 hours to decrease swelling so milk can flow.  7. Pump both breasts when not putting infant to the breast for 15-20 minutes to protect milk supply 8. Pump both breasts after feeding is not softening with feeding 9. While using the Nipple shield, It is recommended that you continue to pump 3-4 times a day after breast feeding to protect supply 10. Offer infant a bottle of pumped breast milk after breastfeeding if he is still cueing to feed or he is very sleepy at the breast 11. Infant  may transition better between breast and bottle is using a slower flow nipple, such as a Dr. Theora Gianotti Level 1 nipple.  12. Feed infant using paced bottle feeding method ([email or web address]) 13. When infant is getting a bottle he needs about 69-93 ml (2.5-3 ounces) 14. Can use breast milk to nipples and then follow with Olive Oil or Coconut Oil 15. Keep up the good work!! 16. Call with any questions/concerns as needed [phone] 17. Thank you for allowing me to assist you today 18. Follow up with Lactation in 1 week   Ed Blalock RN, IBCLC                                                      Ed Blalock 02/24/2018, 10:11 AM

## 2018-03-03 ENCOUNTER — Ambulatory Visit: Payer: Self-pay

## 2018-03-03 NOTE — Lactation Note (Signed)
This note was copied from a baby's chart. 03/03/2018  Name: Angelica [name] MRN: [id number] Date of Birth: 02/07/2016 Gestational Age: Gestational Age: [redacted]w[redacted]d Birth Weight: 115.7 oz Weight today:    8 pounds 10.8 ounces (3934 grams) with clean size 1 diaper  Angelica [name] has gained 296 grams in the last 7 days with an average daily weight gain of 42 grams a day.   Fernande Bras presents today with mom and dad for follow up feeding assessment. Angelica [name], Sport and exercise psychologist present for consult.   Mom reports infant is feeding well. He eats a lot and is always hungry per mom. Infant is sleepy at the breast some of the times per mom.mom uses both breasts with each feeding.  Mom is pumping 3-4 x a day and is obtaining about 3 ounces of breast milk that is being bottle fed to infant daily. Parents are using an Avent Nipple for feeding.   Mom is using a # 20 NS with feedings, she reports infant will not latch without it. Mom's nipples are short shaft. Mom reports she tries daily without the NS. Enc mom to keep trying to remove nipple shield either with initial latch or in the middle of the feeding.    Mom reports her right breast is not hurting as much as it was. She reports she does not get a lot of milk out of the right breast. Mom reports her right breast makes less than half of what her left breast makes. She did try the ice and heat after our last visit and that did help it to soften. She did previously have lumps in the breast that has now resolved. Mom denies redness to the breast and reports she had a fever last week but now is ok. Enc mom to call OB with any fever to r/o Mastitis. Enc mom to massage/compress breast with feeding and to continue pumping post BF 4-6 x a day while using the NS. Discussed with mom it is normal for one breast to make more that the other. Discussed with mom that using the wrong size NS can decrease supply also, enc mom not to use the # 16 NS any more. Mom denies bleeding to  the nipples since last week. She did notice a drop of blood to her right breast yesterday with squeezing. She has not seen any today. Told to call OB if notices bleeding from breast again.   Mom latched infant to the breast with a # 16 NS, infant with very narrow gape. Enc mom to change back up to # 20. # 24 was tried and infant had difficulty with size. Enc mom to use # 20 NS with feedings. Mom with semi compressible breasts and small short shaft everted nipples.   Mom tends to let infant stay far away from the breast, enc her to keep infant pulled in deeply to the breast to keep nose touching the breast. Enc mom to massage breast with feeding, she did not do so after being directed to do so many times.   Infant suckled on the right breast for 20 minutes and transferred 2 ml. Enc mom to keep infant awake and to massage/compress breast with feeding. Enc mom to feed infant STS as she wants to keep him wrapped so he wont get cold.   Infant was latched to the left breast with a # 20 NS and fed actively at first and then started falling asleep. Enc mom to keep infant awake with feeding, she  did so but not always consistently. Infant transferred 16 ml on the left breast. Feeding was completed with bottle feeding and took 40 ml.   Reviewed supply and demand and importance of pumping breasts if not softened with feeding to protect milk supply. Mom does not pump with each feeding.    Infant with thick labial frenulum that inserts in the middle of the face of the gum ridge. Upper lip flanges well. Infant with good tongue cupping and strong suckle on gloved finger. Infant with good tongue elevation, extension and lateralization. Mom reports nipple pain throughout feeding, she reports it lasts throughout feeding. She reports it feels the same with the # 16 and the # 20 NS. Enc mom to keep trying to get infant on the # 24 NS to widen gape. Infant clicks throughout feeding. Infant with recessed chin. Parents report  infant does not drool or choke on his bottles.    Infant to follow up with Pediatrician on May 9th. Infant to follow up with Lactation in 2 weeks. May 16th at 10:00  Parents report all questions/concerns have been answered at this time. Printed AVS for parents to review with interpreter while here. Interpreter reviewed with parents   General Information: Mother's reason for visit: Pain with latch, NS use Consult: Follow-up Lactation consultant: Angelica Stain RN,IBCLC Breastfeeding experience: Breast and bottle feeding, infant eats a lot and seems hungry all the time per mom   Maternal medications: Pre-natal vitamin  Breastfeeding History: Frequency of breast feeding: every 2-3 hours Duration of feeding: 30-40 minutes  Supplementation: Supplement method: bottle(Avent) Brand: Angelica [name] Formula volume: 90 ml Formula frequency: 3-4 x in the last week   Breast milk volume: 90 ml Breast milk frequency: 4 x a day   Pump type: Medela pump in style Pump frequency: 3-4 x a day Pump volume: 3 ounces/pumping  Infant Output Assessment: Voids per 24 hours: 6+, mom has difficulty recalling how often he voids and stools Urine color: Clear yellow Stools per 24 hours: 4+ Stool color: Yellow  Breast Assessment: Breast: Full, Compressible Nipple: Erect Pain level: 4(throughout feeding) Pain interventions: Bra  Feeding Assessment: Infant oral assessment: WNL Infant oral assessment comment: recessed chin Positioning: Cross cradle(right breast) Latch: 1 - Repeated attempts needed to sustain latch, nipple held in mouth throughout feeding, stimulation needed to elicit sucking reflex. Audible swallowing: 1 - A few with stimulation Type of nipple: 2 - Everted at rest and after stimulation Comfort: 2 - Soft/non-tender Hold: 2 - No assistance needed to correctly position infant at breast LATCH score: 8 Latch assessment: Shallow Lips flanged: Yes Suck assessment: Displays both Tools:  Nipple shield 20 mm, Nipple shield 16 mm Pre-feed weight: 3934 grams Post feed weight: 3936 grams Amount transferred: 2 ml Amount supplemented: 0  Additional Feeding Assessment: Infant oral assessment: WNL Infant oral assessment comment: recessed chin Positioning: Cross cradle(left breast) Latch: 1 - Repeated attempts neede to sustain latch, nipple held in mouth throughout feeding, stimulation needed to elicit sucking reflex. Audible swallowing: 1 - A few with stimulation Type of nipple: 2 - Everted at rest and after stimulation Comfort: 2 - Soft/non-tender Hold: 2 - No assistance needed to correctly position infant at breast LATCH score: 8 Latch assessment: Shallow Lips flanged: Yes Suck assessment: Displays both Tools: Nipple shield 20 mm Pre-feed weight: 3936 grams Post feed weight: 3952 grams Amount transferred: 16 ml    Totals: Total amount transferred: 16 ml  Total Amount Supplemented 40 ml  1. [name] the breast with feeding cues 2. Use the # 20 nipple shield with feedings, try to change to the # 24 nipple shield with feeding 3. Keep infant awake at the breast as needed 4. Massage/compress breast with feedings 5. Empty one breast before giving second breast 7. Pump both breasts when not putting infant to the breast for 15-20 minutes to protect milk supply 8. Pump both breasts after feeding is not softening with feeding 9. While using the Nipple shield, It is recommended that you continue to pump 3-4 times a day after breast feeding to protect supply 10. [name] infant a bottle of pumped breast milk after breastfeeding if he is still cueing to feed or he is very sleepy at the breast 11. Infant may transition better between breast and bottle is using a slower flow nipple, such as a Dr. Theora Gianotti Level 1 nipple.  12. Feed infant using paced bottle feeding method ([email or web address]) 13. When infant is getting a bottle he needs about 73-98 ml (2.5-3 ounces) 14. Can use breast milk  to nipples and then follow with Olive Oil or Coconut Oil 15. Keep up the good work!! 16. Call with any questions/concerns as needed [phone] 17. Thank you for allowing me to assist you today 18. Follow up with Lactation in 2 weeks    Angelica Blalock RN, IBCLC                                                   .      Angelica [name] 03/03/2018, 10:01 AM

## 2018-03-19 ENCOUNTER — Ambulatory Visit: Payer: Self-pay

## 2018-03-19 NOTE — Lactation Note (Signed)
This note was copied from a baby's chart.  03/19/2018  Name: Angelica Angelica [name] MRN: [id number] Date of Birth: 02/07/2016 Gestational Age: Gestational Age: [redacted]w[redacted]d Birth Weight: 115.7 oz Weight today:    9 pounds 10.8 ounces (4388 grams) with clean size 1 diaper  Angelica Angelica [name] has gained 454 grams in the last 16 days with an average daily weight gain of 28 grams a day.  Angelica Angelica [name] presents today with parents for follow up feeding assessment. Angelica Angelica [name], [employer].   Mom reports infant does not like the breast as well. She is feeding using the # 24 NS with all feedings, infant will not latch without it. Infant feeds at the breast 2-3 x a day. Mom reports infant is sleepier at the breast than with the bottle. Mom is attempting to keep him awake. Parents give a bottle post BF and he takes 40-50 ml. When getting bottle only infant will take 90-100 ml. Infant chokes a little on the bottle. Parents deny any spitting up.   Mom is worried infant is cold while STS, reviewed importance of keeping him awake and if he will eat bundled that is ok, but STS is best when sleepy. Interpreter reports in their culture they like to keep infants bundled and warm so they wont "catch the wind" and get a cold.   Mom is pumping 3 x a day and gets 3 oz/pumping, she reports her breasts are softening with feedings/pumpings.   Mom reports some pain throughout the feeding. Enc her to keep infant pulled closer to the breast. Mom says she does do that but infant noted not to be pulled close. Infant with recessed chin. Mom with firm semi compressible nipples that do not stretch well, suspect reason infant needing NS.   Mom reports she usually has enough breast milk to feed infant with an occasional bottle of Angelica [name] as needed. Parents report they fed infant Angelica [name] last week as he had diarrhea. Discussed breast milk is good to feed if infant has diarrhea and that breast milk stools are looser in appearance. Mom asked  what medicine she can give if infant has a tummy ache or diarrhea, referred back to [facility].   Mom concerned infant needs NS, Enc her to keep trying daily to see if he can nurse without it. Discussed trying in the middle of the feeding or on the beginning.   Mom was feeding infant a bottle when I picked her up in the clinic. Enc her to stop the feeding so BF can be assessed. She did so.   Mom latched infant to the left breast with the # 24 NS. Infant latched easily with active suckling in the beginning. Infant with wide gape. Nipple is pulled up well in the NS and milk in the NS after feeding. He fell asleep easily. Mom does not keep infant close to the breast with feeding and does not stimulate infant very often, although reminded to do so. Infant fed for about 20 minutes, he was weighed and transferred 42 ml. Mom burped him and latched him to the right breast in the cross cradle hold. Mom held infant closer to the breast on the right side. He started out actively feeding and was more awake although not always suckling. Feeding has improved a lot in the last 2 weeks. Infant fed on the right breast for about 20 minutes and transferred 6 ml. Parents finished feeding with a bottle. Infant took   Mom is complaining of headache and dizziness  daily. She is taking Tylenol. They report they have an appointment on June 4th, this is her 6 week follow up with was rescheduled as mom was not able to make previous appointment. Parents report they have not told the doctor's office that she is having headaches. Advised mom to call OB office today and inform them she is having headaches and dizziness.   Parents asked about lump behind left ear that was previously assessed by Dr. Ezzard Standing. Reiterated that per Dr. Note it was ok. Parents to call Ped if they have further questions/concerns.   Praised parents for their hard work in feeding their infant. Informed them he is having good weight gain.   Infant to follow up with  Pediatrician in June. Infant to follow up with Lactation as needed.   Parents report all questions have been answered. Interpreter reviewed follow up instructions with family.   General Information: Mother's reason for visit: follow up feeding assessment, NS use, pain with feedings         Maternal medications: Pre-natal vitamin, Tylenol (acetaminophen)  Breastfeeding History: Frequency of breast feeding: 2-3 x a day Duration of feeding: 20 minutes  Supplementation: Supplement method: bottle(Avent nipple) Brand: Angelica Angelica [name] volume: 90 ml Angelica [name] frequency: 3-4 x a week   Breast milk volume: 40-100 ml Breast milk frequency: 6-7 x a day   Pump type: Medela pump in style Pump frequency: 3 x a day Pump volume: 3 ounces  Infant Output Assessment: Voids per 24 hours: 6+ Urine color: Clear yellow Stools per 24 hours: 3-4 Stool color: Yellow  Breast Assessment: Breast: Soft Nipple: Erect Pain level: 2(pain throughout feeding) Pain interventions: Bra  Feeding Assessment: Infant oral assessment: WNL Infant oral assessment comment: recessed chin Positioning: Cross cradle(left breast) Latch: 1 - Repeated attempts needed to sustain latch, nipple held in mouth throughout feeding, stimulation needed to elicit sucking reflex. Audible swallowing: 2 - Spontaneous and intermittent Type of nipple: 2 - Everted at rest and after stimulation Comfort: 2 - Soft/non-tender Hold: 2 - No assistance needed to correctly position infant at breast LATCH score: 9 Latch assessment: Shallow Lips flanged: Yes Suck assessment: Displays both Tools: Nipple shield 24 mm Pre-feed weight: 4388 grams Post feed weight: 4430 grams Amount transferred: 42 ml Amount supplemented: 0  Additional Feeding Assessment: Infant oral assessment: WNL Infant oral assessment comment: recessed chin Positioning: Cross cradle(right breast) Latch: 1 - Repeated attempts neede to sustain latch, nipple  held in mouth throughout feeding, stimulation needed to elicit sucking reflex. Audible swallowing: 2 - Spontaneous and intermittent Type of nipple: 2 - Everted at rest and after stimulation Comfort: 2 - Soft/non-tender Hold: 2 - No assistance needed to correctly position infant at breast LATCH score: 9 Latch assessment: Shallow Lips flanged: Yes Suck assessment: Displays both Tools: Nipple shield 24 mm Pre-feed weight: 4430 grams Post feed weight: 4436 grams Amount transferred: 6 ml Amount supplemented: 60 ml  Totals: Total amount transferred: 48 ml Total supplement given: 60 ml Total amount pumped post feed: 0  1. Angelica [name] the breast with feeding cues 2. Use the # 24 nipple shield with feedings as needed, try each day to nurse without the nipple shield either in the beginning of the feeding or in the middle of the feeding 3. Keep infant awake at the breast as needed 4. Massage/compress breast with feedings 5. Empty one breast before giving second breast 7. Pump both breasts when not putting infant to the breast for 15-20 minutes to protect milk supply  8. Pump both breasts after feeding is not softening with feeding 9. While using the Nipple shield, It is recommended that you continue to pump 3-4 times a day after breast feeding to protect supply 10. Angelica [name] infant a bottle of pumped breast milk after breastfeeding if he is still cueing to feed or he is very sleepy at the breast 11. Infant may transition better between breast and bottle is using a slower flow nipple, such as a Dr. Theora Gianotti Level 1 nipple.  12. Feed infant using paced bottle feeding method ([email or web address]) 13. When infant is getting a bottle he needs about 81-108  ml (3-3.5 ounces) for 8 feedings a day or 645-860 ml (22 ounces-28 ounces) in 24 hours 14. Can use breast milk to nipples and then follow with Olive Oil or Coconut Oil 15. Keep up the good work!! 16. Call with any questions/concerns as needed [phone] 17.  Thank you for allowing me to assist you today 18. Follow up with Lactation as needed  Ed Blalock RN, IBCLC                                                    Angelica Angelica [name] Angelica Angelica [name] 03/19/2018, 10:02 AM

## 2019-07-02 IMAGING — US US MFM FETAL BPP W/O NON-STRESS
1 series · 12 of 15 positions shown · non-contrast
Comparison: none

[Series 1: us mfm fetal bpp w/o non-stress · 15 acquisitions, 12 frames shown]
[im 1/15]
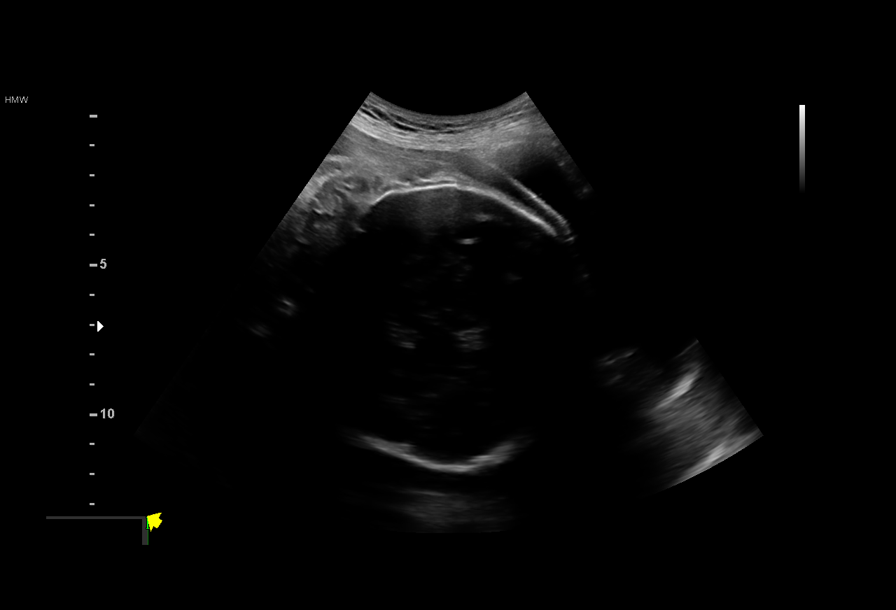
[im 2/15]
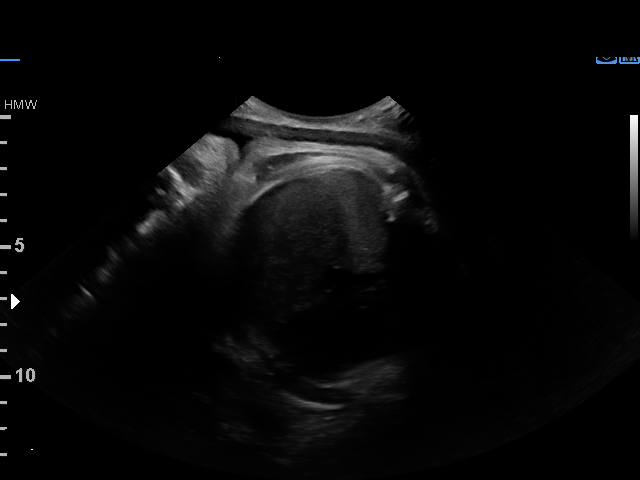
[im 4/15]
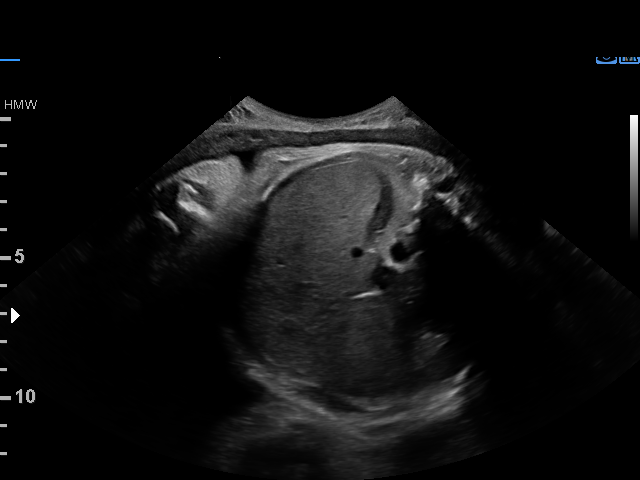
[im 5/15]
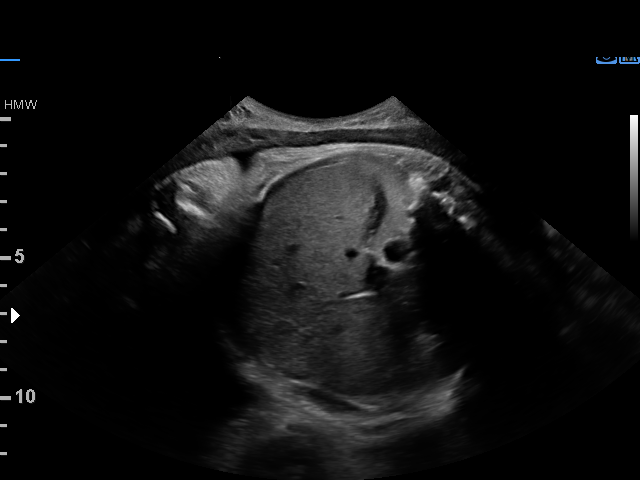
[im 6/15]
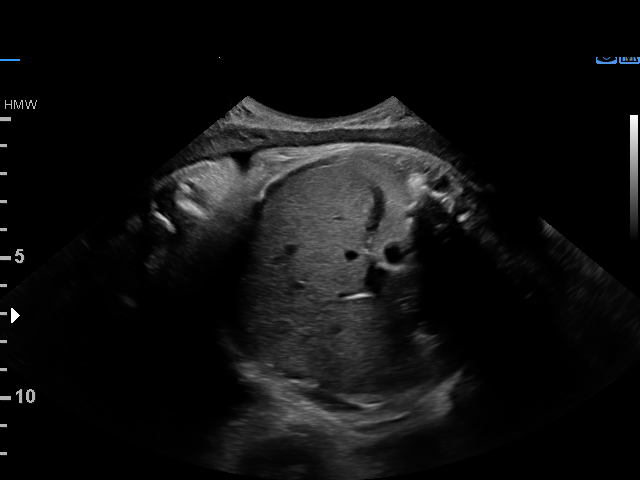
[im 7/15]
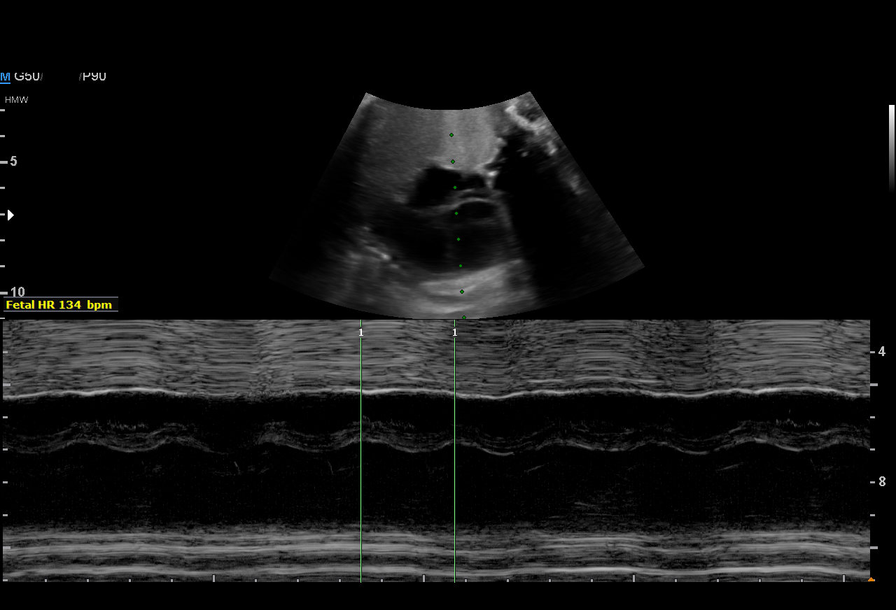
[im 9/15]
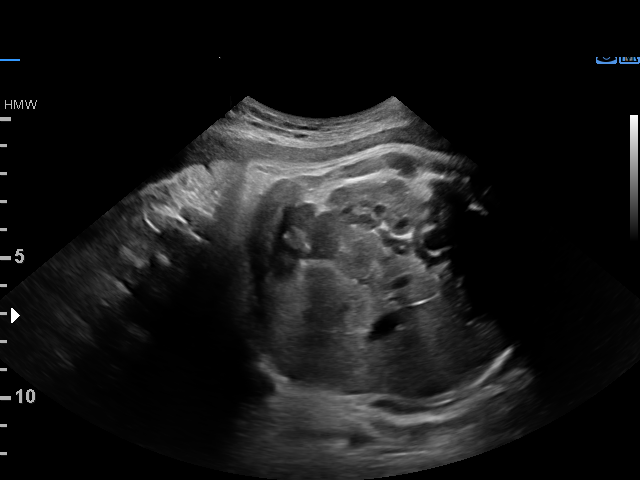
[im 10/15]
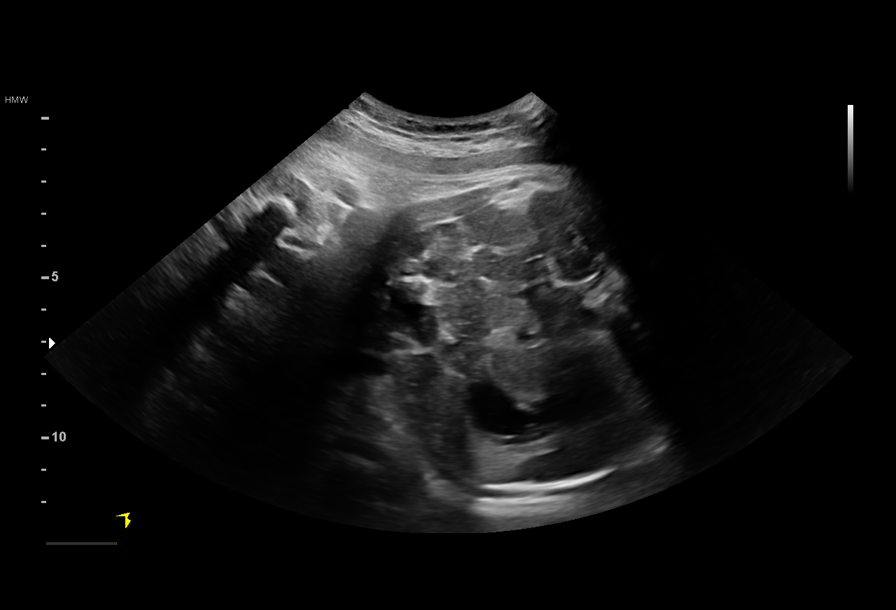
[im 11/15]
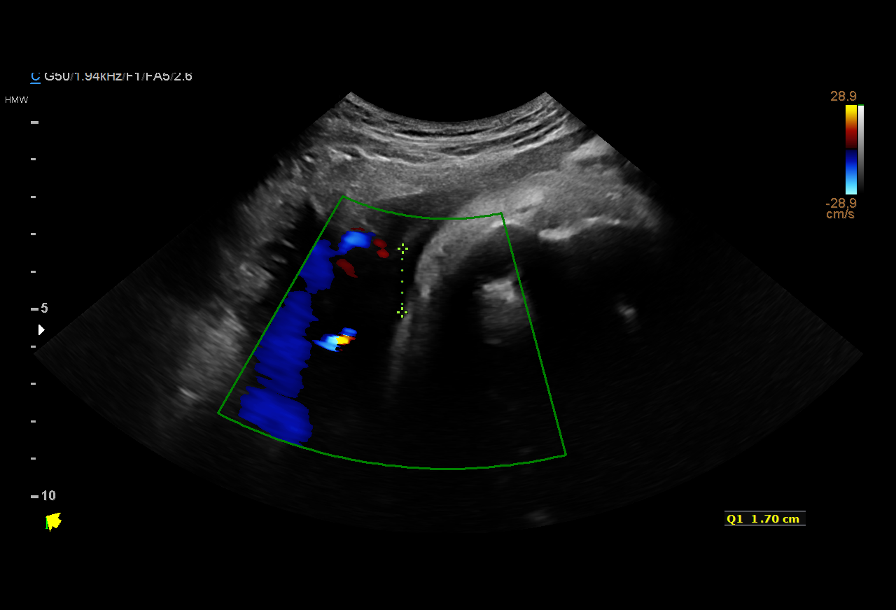
[im 12/15]
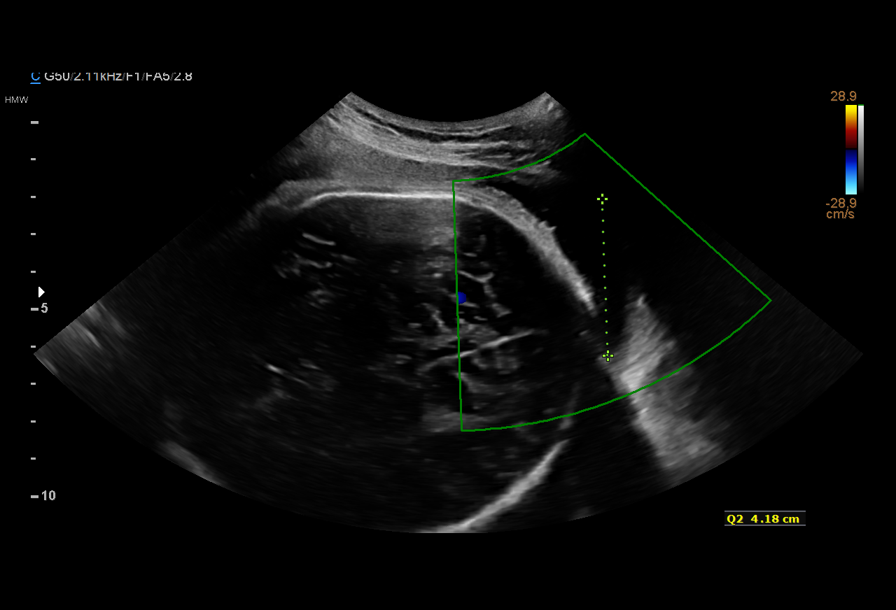
[im 14/15]
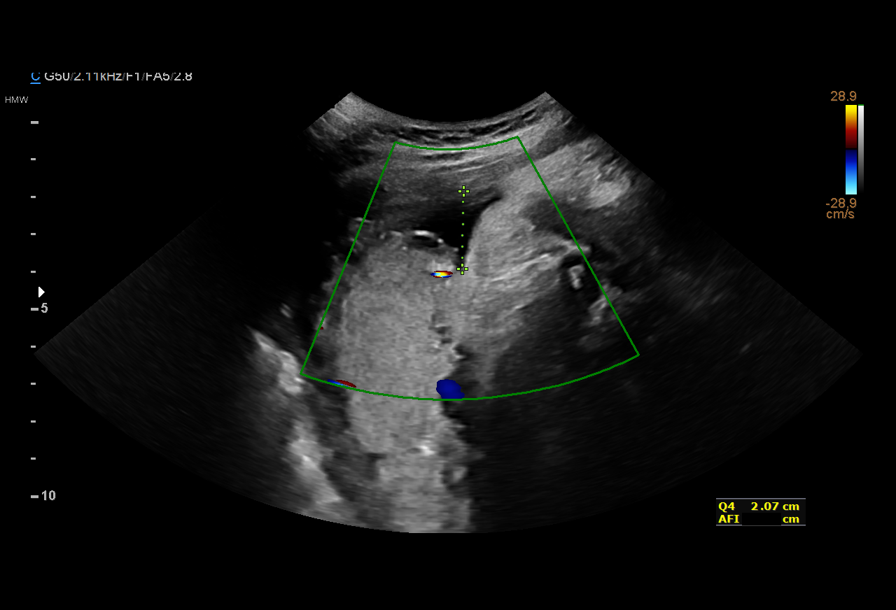
[im 15/15]
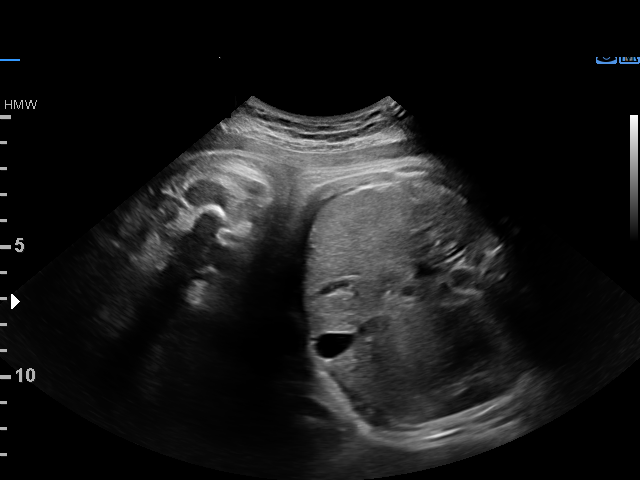

[12 of 15 positions shown; findings below may reference images not displayed]

[REDACTED]-
Faculty Physician

1  ARISSA                [id number]      [phone]     [id number]
MEDEIRO
Indications

40 weeks gestation of pregnancy
Postdate pregnancy (40-42 weeks)
OB History

Gravidity:    1         Term:   0        Prem:   0        SAB:   0
TOP:          0       Ectopic:  0        Living: 0
Fetal Evaluation

Num Of Fetuses:     1
Fetal Heart         134
Rate(bpm):
Cardiac Activity:   Observed
Presentation:       Cephalic

Amniotic Fluid
AFI FV:      Subjectively within normal limits

AFI Sum(cm)     %Tile       Largest Pocket(cm)
10.95           43

RUQ(cm)       RLQ(cm)       LUQ(cm)        LLQ(cm)
1.7           2.07          4.18           3
Biophysical Evaluation

Amniotic F.V:   Within normal limits       F. Tone:        Observed
F. Movement:    Observed                   Score:          [DATE]
F. Breathing:   Observed
Gestational Age

Clinical EDD:  40w 6d                                        EDD:   01/30/18
Best:          40w 6d     Det. By:  Clinical EDD             EDD:   01/30/18
Anatomy

Stomach:               Appears normal, left   Bladder:                Appears normal
sided
Impression

Single living intrauterine pregnancy at 40w 6d.
Cephalic presentation.
Normal amniotic fluid volume with an AFI > 10 cm.
BPP [DATE].
Recommendations

Follow-up ultrasounds as clinically indicated.

## 2023-12-22 ENCOUNTER — Emergency Department (HOSPITAL_COMMUNITY): Payer: BLUE CROSS/BLUE SHIELD

## 2023-12-22 ENCOUNTER — Ambulatory Visit: Payer: BLUE CROSS/BLUE SHIELD

## 2023-12-22 ENCOUNTER — Other Ambulatory Visit: Payer: Self-pay

## 2023-12-22 ENCOUNTER — Encounter (HOSPITAL_COMMUNITY): Payer: Self-pay | Admitting: Emergency Medicine

## 2023-12-22 ENCOUNTER — Emergency Department (HOSPITAL_COMMUNITY)
Admission: EM | Admit: 2023-12-22 | Discharge: 2023-12-22 | Disposition: A | Discharge status: Home / Self Care | Payer: BLUE CROSS/BLUE SHIELD | Attending: Emergency Medicine | Admitting: Emergency Medicine

## 2023-12-22 DIAGNOSIS — O26891 Other specified pregnancy related conditions, first trimester: Secondary | ICD-10-CM | POA: Diagnosis present

## 2023-12-22 DIAGNOSIS — O200 Threatened abortion: Secondary | ICD-10-CM | POA: Insufficient documentation

## 2023-12-22 DIAGNOSIS — Z3A01 Less than 8 weeks gestation of pregnancy: Secondary | ICD-10-CM | POA: Diagnosis not present

## 2023-12-22 LAB — COMPREHENSIVE METABOLIC PANEL
ALT: 20 U/L (ref 0–44)
AST: 23 U/L (ref 15–41)
Albumin: 4 g/dL (ref 3.5–5.0)
Alkaline Phosphatase: 52 U/L (ref 38–126)
Anion gap: 13 (ref 5–15)
BUN: 7 mg/dL (ref 6–20)
CO2: 19 mmol/L — ABNORMAL LOW (ref 22–32)
Calcium: 8.8 mg/dL — ABNORMAL LOW (ref 8.9–10.3)
Chloride: 103 mmol/L (ref 98–111)
Creatinine, Ser: 0.63 mg/dL (ref 0.44–1.00)
GFR, Estimated: >60 mL/min (ref >60)
Glucose, Bld: 93 mg/dL (ref 70–99)
Potassium: 3 mmol/L — ABNORMAL LOW (ref 3.5–5.1)
Sodium: 135 mmol/L (ref 135–145)
Total Bilirubin: 0.4 mg/dL (ref 0.0–1.2)
Total Protein: 7.9 g/dL (ref 6.5–8.1)

## 2023-12-22 LAB — CBC
HCT: 31.8 % — ABNORMAL LOW (ref 36.0–46.0)
Hemoglobin: 9.2 g/dL — ABNORMAL LOW (ref 12.0–15.0)
MCH: 18.3 pg — ABNORMAL LOW (ref 26.0–34.0)
MCHC: 28.9 g/dL — ABNORMAL LOW (ref 30.0–36.0)
MCV: 63.2 fL — ABNORMAL LOW (ref 80.0–100.0)
Platelets: 331 10*3/uL (ref 150–400)
RBC: 5.03 MIL/uL (ref 3.87–5.11)
RDW: 19.7 % — ABNORMAL HIGH (ref 11.5–15.5)
WBC: 12.7 10*3/uL — ABNORMAL HIGH (ref 4.0–10.5)
nRBC: 0 % (ref 0.0–0.2)

## 2023-12-22 LAB — CBG MONITORING, ED: Glucose-Capillary: 82 mg/dL (ref 70–99)

## 2023-12-22 LAB — HCG, QUANTITATIVE, PREGNANCY: hCG, Beta Chain, Quant, S: 6801 m[IU]/mL — ABNORMAL HIGH (ref <5)

## 2023-12-22 LAB — HCG, SERUM, QUALITATIVE: Preg, Serum: POSITIVE — AB

## 2023-12-22 MED ORDER — POTASSIUM CHLORIDE CRYS ER 20 MEQ PO TBCR
40.0000 meq | EXTENDED_RELEASE_TABLET | Freq: Once | ORAL | Status: AC
  Administered 2023-12-22: 40 meq via ORAL
  Filled 2023-12-22: qty 2

## 2023-12-22 NOTE — ED Provider Notes (Signed)
  Physical Exam  BP (!) 113/51   Pulse 97   Temp 98.1 F (36.7 C) (Oral)   Resp 18   SpO2 100%   Physical Exam Constitutional:      Appearance: Normal appearance. She is normal weight.  HENT:     Head: Normocephalic and atraumatic.  Cardiovascular:     Rate and Rhythm: Normal rate and regular rhythm.  Pulmonary:     Effort: Pulmonary effort is normal.     Breath sounds: Normal breath sounds.  Musculoskeletal:     Right lower leg: No edema.     Left lower leg: No edema.  Neurological:     Mental Status: She is alert.     Procedures  Procedures  ED Course / MDM   Clinical Course as of 12/22/23 0859  Mon Dec 22, 2023  0746 O+ ABO [JB]    Clinical Course User Index [JB] Jakota Manthei, Horald Chestnut, PA-C   Medical Decision Making Amount and/or Complexity of Data Reviewed Labs: ordered. Radiology: ordered.  Risk Prescription drug management.   Patient was received in handoff from prior ED PA.  In short last menstrual period was 8 weeks ago.  She is now experiencing vaginal bleeding.  No confirmed intrauterine pregnancy on ultrasound, no identified ectopic. Awaiting ABO to determine if patient needs RhoGAM.  Patient will be stable for discharge, does have follow-up with women's health today at 830am.   Blood bank has confirmed that patient is O+.  Patient will be discharged.  Patient understands labs and imaging.  She will follow-up with her women's health provider today.  She understands to have hCG rechecked in 48 hours.  Stable for discharge.   Interpreter was offered and used.        Smitty Knudsen, PA-C 12/22/23 0900    Alvira Monday, MD 12/23/23 0001

## 2023-12-22 NOTE — ED Provider Notes (Signed)
 [place] EMERGENCY DEPARTMENT AT [facility] Provider Note   CSN: [id number] Arrival date & time: 12/22/23  7829     History  Chief Complaint  Patient presents with   Vaginal Bleeding    Angelica [name] is a 43 y.o. female.  43 y/o G2P1 female, currently [redacted]w[redacted]d pregnant by LMP (10/21/23), presents to the ED for abdominal pain. Reports LLQ abdominal pain beginning around midnight. She has been experiencing associated vaginal bleeding with the passage of some clots. States bleeding has slowed. Required the use of 2 pad for bleeding control. No fevers, nausea, vomiting, syncope, urinary symptoms. Patient not on chronic anticoagulation. No medications taken PTA for pain.  The history is provided by the patient. A language interpreter was used.  Vaginal Bleeding      Home Medications Prior to Admission medications   Medication Sig Start Date End Date Taking? Authorizing Provider  ibuprofen (ADVIL,MOTRIN) 600 MG tablet Take 1 tablet (600 mg total) by mouth every 6 (six) hours. 02/09/18   Donette Larry, CNM  Prenatal Vit-Fe Fumarate-FA (PRENATAL VITAMIN PO) Take by mouth.    [provider]      Allergies    Patient has no known allergies.    Review of Systems   Review of Systems  Genitourinary:  Positive for vaginal bleeding.  Ten systems reviewed and are negative for acute change, except as noted in the HPI.    Physical Exam Updated Vital Signs BP (!) 113/51   Pulse 97   Temp 98 F (36.7 C)   Resp 18   SpO2 100%   Physical Exam Vitals and nursing note reviewed. Exam conducted with a chaperone present.  Constitutional:      General: She is not in acute distress.    Appearance: She is well-developed. She is not diaphoretic.     Comments: Resting calmly. Nontoxic appearing.  HENT:     Head: Normocephalic and atraumatic.  Eyes:     General: No scleral icterus.    Conjunctiva/sclera: Conjunctivae normal.  Cardiovascular:     Rate and Rhythm: Normal  rate and regular rhythm.     Pulses: Normal pulses.  Pulmonary:     Effort: Pulmonary effort is normal. No respiratory distress.     Comments: Respirations even and unlabored Abdominal:     Comments: TTP in the LLQ of the abdomen without guarding or peritoneal signs. No palpable masses. No abdominal distension.  Genitourinary:    Comments: Scant bleeding in vaginal vault. No clots. Exam chaperoned by RN. Musculoskeletal:        General: Normal range of motion.     Cervical back: Normal range of motion.  Skin:    General: Skin is warm and dry.     Coloration: Skin is not pale.     Findings: No erythema or rash.  Neurological:     Mental Status: She is alert and oriented to person, place, and time.     Coordination: Coordination normal.  Psychiatric:        Behavior: Behavior normal.     ED Results / Procedures / Treatments   Labs (all labs ordered are listed, but only abnormal results are displayed) Labs Reviewed  HCG, SERUM, QUALITATIVE - Abnormal; Notable for the following components:      Result Value   Preg, Serum POSITIVE (*)    All other components within normal limits  CBC - Abnormal; Notable for the following components:   WBC 12.7 (*)    Hemoglobin 9.2 (*)  HCT 31.8 (*)    MCV 63.2 (*)    MCH 18.3 (*)    MCHC 28.9 (*)    RDW 19.7 (*)    All other components within normal limits  COMPREHENSIVE METABOLIC PANEL - Abnormal; Notable for the following components:   Potassium 3.0 (*)    CO2 19 (*)    Calcium 8.8 (*)    All other components within normal limits  HCG, QUANTITATIVE, PREGNANCY - Abnormal; Notable for the following components:   hCG, Beta Chain, Quant, S 6,801 (*)    All other components within normal limits  ABO/RH    EKG None  Radiology US OB LESS THAN 14 WEEKS WITH OB TRANSVAGINAL Result Date: 12/22/2023 CLINICAL DATA:  Initial evaluation for acute vaginal bleeding, early pregnancy. EXAM: OBSTETRIC <14 WK [place] AND TRANSVAGINAL OB US TECHNIQUE:  Both transabdominal and transvaginal ultrasound examinations were performed for complete evaluation of the gestation as well as the maternal uterus, adnexal regions, and pelvic cul-de-sac. Transvaginal technique was performed to assess early pregnancy. COMPARISON:  None Available. FINDINGS: Intrauterine gestational sac: Negative Yolk sac:  Negative Embryo:  Negative Cardiac Activity: Negative Heart Rate: N/A Subchorionic hemorrhage:  None visualized. Maternal uterus/adnexae: Right ovary within normal limits. 1.5 cm simple left ovarian cyst, likely a small follicular cyst. No other adnexal mass. Trace free fluid present within the pelvis. Uterus is retroverted. Endometrial stripe measures 11.2 mm in thickness. IMPRESSION: 1. Early pregnancy with no discrete IUP or adnexal mass identified, consistent with a pregnancy of unknown anatomic location. Differential considerations include IUP to early to visualize, recent SAB, or possibly occult ectopic pregnancy. Close clinical monitoring with serial beta HCGs and close interval follow-up ultrasound recommended as clinically warranted. 2. 1.5 cm simple left ovarian cyst, likely a small follicular cyst. 3. Trace free fluid within the pelvis, nonspecific, but most commonly physiologic. Electronically Signed   By: Rise Mu M.D.   On: 12/22/2023 06:01    Procedures Procedures    Medications Ordered in ED Medications - No data to display  ED Course/ Medical Decision Making/ A&P                                 Medical Decision Making Amount and/or Complexity of Data Reviewed Labs: ordered. Radiology: ordered.   This patient presents to the ED for concern of vaginal bleeding in pregnancy, this involves an extensive number of treatment options, and is a complaint that carries with it a high risk of complications and morbidity.  The differential diagnosis includes threatened abortion vs active miscarriage vs ectopic pregnancy vs subchorionic  hematoma.   Co morbidities that complicate the patient evaluation  None known   Additional history obtained:  Additional history obtained from spouse, at bedside   Lab Tests:  I Ordered, and personally interpreted labs.  The pertinent results include:  WBC 12.7 (nonspecific), Hgb 9.2 w/low MCV (previously 12.8 in 2019). Potassium 3.0. hCG 6,801.   Imaging Studies ordered:  I ordered imaging studies including OB ultrasound I independently visualized and interpreted imaging which showed evidence of early pregnancy w/o discrete IUP. No current evidence for ectopic. I agree with the radiologist interpretation   Cardiac Monitoring:  The patient was maintained on a cardiac monitor.  I personally viewed and interpreted the cardiac monitored which showed an underlying rhythm of: NSR   Medicines ordered and prescription drug management:  I have reviewed the patients home medicines and  have made adjustments as needed   Test Considered:  PT/INR   Problem List / ED Course:  As above   Reevaluation:  After the interventions noted above, I reevaluated the patient and found that they have :stayed the same   Social Determinants of Health:  Language barrier   Dispostion:  Care signed out to Barrett, PA-C at shift change. If ABO/Rh shows no indication for RhoGAM, anticipate discharge for patient to follow up at her scheduled OBGYN appt this morning.         Final Clinical Impression(s) / ED Diagnoses Final diagnoses:  Threatened abortion    Rx / DC Orders ED Discharge Orders     None         Antony Madura, PA-C 12/22/23 7829    Nira Conn, MD 12/22/23 [phone]

## 2023-12-22 NOTE — ED Triage Notes (Addendum)
 Patient c/o vaginal bleeding tonight. Family report patient is 1 month pregnant. Patient denies N/V.

## 2023-12-22 NOTE — ED Notes (Signed)
 CBG was collected on wrong patient. Pt did not require a CBG test.

## 2023-12-22 NOTE — ED Notes (Signed)
 In room at this time with PA for pelvic exam with interpreter

## 2023-12-22 NOTE — ED Notes (Signed)
 Called Lab for follow up on blood test for PA.

## 2023-12-22 NOTE — Discharge Instructions (Signed)
 While your ultrasound showed evidence of early pregnancy, it is unclear whether your pregnancy is progressing appropriately or in the correct location. Take tylenol for cramping as needed.  Follow up at your scheduled OB/GYN appointment later this morning.  You will need to schedule a follow-up appointment in 48 hours to have your blood pregnancy test repeated.   Go to [facility] for any new or concerning symptoms.

## 2023-12-26 ENCOUNTER — Inpatient Hospital Stay (HOSPITAL_COMMUNITY)
Admission: AD | Admit: 2023-12-26 | Discharge: 2023-12-26 | Disposition: A | Discharge status: Home / Self Care | Payer: BLUE CROSS/BLUE SHIELD | Attending: Obstetrics & Gynecology | Admitting: Obstetrics & Gynecology

## 2023-12-26 ENCOUNTER — Encounter (HOSPITAL_COMMUNITY): Payer: Self-pay | Admitting: *Deleted

## 2023-12-26 DIAGNOSIS — O039 Complete or unspecified spontaneous abortion without complication: Secondary | ICD-10-CM | POA: Diagnosis present

## 2023-12-26 DIAGNOSIS — O3680X0 Pregnancy with inconclusive fetal viability, not applicable or unspecified: Secondary | ICD-10-CM

## 2023-12-26 DIAGNOSIS — Z3A00 Weeks of gestation of pregnancy not specified: Secondary | ICD-10-CM

## 2023-12-26 LAB — HCG, QUANTITATIVE, PREGNANCY: hCG, Beta Chain, Quant, S: 190 m[IU]/mL — ABNORMAL HIGH (ref <5)

## 2023-12-26 NOTE — MAU Note (Signed)
 Angelica [name] is a 43 y.o. at Unknown here in MAU reporting: doing ok, just feeling a little pain in LLQ of abd. Started on Sunday, has gotten better.  Has not had any bleeding  today, has stopped.  Bleeding had started on Sunday also, was heavy. LMP: 12/17 Onset of complaint: Sunday Pain score: mild Vitals:   12/26/23 1327  BP: 113/73  Pulse: 94  Resp: 16  Temp: 98.3 F (36.8 C)  SpO2: 100%      Lab orders placed from triage:  blood work has been drawn

## 2023-12-26 NOTE — MAU Provider Note (Signed)
 Chief Complaint: Follow-up  SUBJECTIVE HPI: Angelica [name] is a 43 y.o. G2P1001 at Unknown by LMP who presents to maternity admissions reporting for follow up bHCG. Was seen in [facility] 2/17 for abdominal pain, vaginal bleeding and found to have pregnancy of uknown location with recommendation for follow up bHCG in 48 hours. Since she had heavy bleeding Sunday and cramping. Today she has mild LLQ abdominal pain and is no longer bleeding.  She denies vaginal bleeding, vaginal itching/burning, urinary symptoms, h/a, dizziness, n/v, or fever/chills.    HPI  Past Medical History:  Diagnosis Date   Medical history non-contributory    Past Surgical History:  Procedure Laterality Date   NO PAST SURGERIES     Social History   Socioeconomic History   Marital status: Married    Spouse name: Not on file   Number of children: Not on file   Years of education: Not on file   Highest education level: Not on file  Occupational History   Not on file  Tobacco Use   Smoking status: Never   Smokeless tobacco: Never  Vaping Use   Vaping status: Never Used  Substance and Sexual Activity   Alcohol use: Never   Drug use: Never   Sexual activity: Not Currently    Birth control/protection: None  Other Topics Concern   Not on file  Social History Narrative   Not on file   Social Drivers of Health   Financial Resource Strain: Not on file  Food Insecurity: Not on file  Transportation Needs: Not on file  Physical Activity: Not on file  Stress: Not on file  Social Connections: Not on file  Intimate Partner Violence: Not on file   No current facility-administered medications on file prior to encounter.   Current Outpatient Medications on File Prior to Encounter  Medication Sig Dispense Refill   ibuprofen (ADVIL,MOTRIN) 600 MG tablet Take 1 tablet (600 mg total) by mouth every 6 (six) hours. 30 tablet 0   Prenatal Vit-Fe Fumarate-FA (PRENATAL VITAMIN PO) Take by mouth.     No Known  Allergies  ROS:  Pertinent positives/negatives listed above.  I have reviewed patient's Past Medical Hx, Surgical Hx, Family Hx, Social Hx, medications and allergies.   Physical Exam  Patient Vitals for the past 24 hrs:  BP Temp Temp src Pulse Resp SpO2 Height Weight  12/26/23 1327 113/73 98.3 F (36.8 C) Oral 94 16 100 % 5\' 1"  (1.549 m) 59.3 kg   Constitutional: Well-developed, well-nourished female in no acute distress.  Cardiovascular: normal rate Respiratory: normal effort GI: Abd soft, non-tender. Pos BS x 4 MS: Extremities nontender, no edema, normal ROM Neurologic: Alert and oriented x 4.  GU: Neg CVAT.  PELVIC EXAM: Cervix pink, visually closed, without lesion, scant white creamy discharge, vaginal walls and external genitalia normal Bimanual exam: Cervix 0/long/high, firm, anterior, neg CMT, uterus nontender, nonenlarged, adnexa without tenderness, enlargement, or mass   LAB RESULTS Results for orders placed or performed during the hospital encounter of 12/26/23 (from the past 24 hours)  hCG, quantitative, pregnancy     Status: Abnormal   Collection Time: 12/26/23 11:51 AM  Result Value Ref Range   hCG, Beta Chain, Quant, S 190 (H) <5 mIU/mL       IMAGING US OB LESS THAN 14 WEEKS WITH OB TRANSVAGINAL Result Date: 12/22/2023 CLINICAL DATA:  Initial evaluation for acute vaginal bleeding, early pregnancy. EXAM: OBSTETRIC <14 WK [place] AND TRANSVAGINAL OB US TECHNIQUE: Both transabdominal and  transvaginal ultrasound examinations were performed for complete evaluation of the gestation as well as the maternal uterus, adnexal regions, and pelvic cul-de-sac. Transvaginal technique was performed to assess early pregnancy. COMPARISON:  None Available. FINDINGS: Intrauterine gestational sac: Negative Yolk sac:  Negative Embryo:  Negative Cardiac Activity: Negative Heart Rate: N/A Subchorionic hemorrhage:  None visualized. Maternal uterus/adnexae: Right ovary within normal limits. 1.5  cm simple left ovarian cyst, likely a small follicular cyst. No other adnexal mass. Trace free fluid present within the pelvis. Uterus is retroverted. Endometrial stripe measures 11.2 mm in thickness. IMPRESSION: 1. Early pregnancy with no discrete IUP or adnexal mass identified, consistent with a pregnancy of unknown anatomic location. Differential considerations include IUP to early to visualize, recent SAB, or possibly occult ectopic pregnancy. Close clinical monitoring with serial beta HCGs and close interval follow-up ultrasound recommended as clinically warranted. 2. 1.5 cm simple left ovarian cyst, likely a small follicular cyst. 3. Trace free fluid within the pelvis, nonspecific, but most commonly physiologic. Electronically Signed   By: Rise Mu M.D.   On: 12/22/2023 06:01    MAU Management/MDM: Orders Placed This Encounter  Procedures   Beta hCG quant (ref lab)   hCG, quantitative, pregnancy   Discharge patient    No orders of the defined types were placed in this encounter.   ASSESSMENT 1. Pregnancy of unknown anatomic location   2. Miscarriage   bHCG 6801 (2/17) -> 190 (2/21)  PLAN Discharge home with strict return precautions. Repeat bHCG in 1-2 week at [facility] - message sent for scheduling  Allergies as of 12/26/2023   No Known Allergies      Medication List     TAKE these medications    ibuprofen 600 MG tablet Commonly known as: ADVIL Take 1 tablet (600 mg total) by mouth every 6 (six) hours.   PRENATAL VITAMIN PO Take by mouth.        Follow-up Information     Center for [facility] Follow up.   Specialty: Obstetrics and Gynecology Contact information: [address] [place] [address] [phone]                Wyn Forster, MD FMOB Fellow, Faculty practice [facility], Center for [facility]  12/26/2023  4:22 PM

## 2023-12-26 NOTE — MAU Note (Signed)
 Notified Main Lab of different order placed. Lab stated they would run a save tube for the new order.

## 2023-12-27 LAB — BETA HCG QUANT (REF LAB): hCG Quant: 142 m[IU]/mL

## 2024-01-01 ENCOUNTER — Telehealth: Payer: Self-pay | Admitting: Family Medicine

## 2024-01-01 NOTE — Telephone Encounter (Signed)
 Spoke to patient with interpreter [phone]. Patient stated she would call back to get scheduled because she needed to find transportation.

## 2024-01-01 NOTE — Telephone Encounter (Signed)
 Called patient with pac[place] interpreter to let her know that her insurance was out of network with [place] and would either have to change insurance, pay for appointment or go to an OBGYN practice that accepts [facility]. She understood and said that at the moment was ok and would call if she needed anything.

## 2024-03-14 ENCOUNTER — Encounter (HOSPITAL_COMMUNITY): Payer: Self-pay

## 2024-03-14 ENCOUNTER — Emergency Department (HOSPITAL_COMMUNITY)

## 2024-03-14 ENCOUNTER — Emergency Department (HOSPITAL_COMMUNITY)
Admission: EM | Admit: 2024-03-14 | Discharge: 2024-03-14 | Disposition: A | Discharge status: Home / Self Care | Attending: Emergency Medicine | Admitting: Emergency Medicine

## 2024-03-14 ENCOUNTER — Other Ambulatory Visit: Payer: Self-pay

## 2024-03-14 DIAGNOSIS — M25511 Pain in right shoulder: Secondary | ICD-10-CM | POA: Insufficient documentation

## 2024-03-14 DIAGNOSIS — S80811A Abrasion, right lower leg, initial encounter: Secondary | ICD-10-CM | POA: Diagnosis not present

## 2024-03-14 DIAGNOSIS — R0781 Pleurodynia: Secondary | ICD-10-CM | POA: Diagnosis not present

## 2024-03-14 DIAGNOSIS — R1030 Lower abdominal pain, unspecified: Secondary | ICD-10-CM | POA: Diagnosis not present

## 2024-03-14 DIAGNOSIS — S8991XA Unspecified injury of right lower leg, initial encounter: Secondary | ICD-10-CM | POA: Diagnosis present

## 2024-03-14 DIAGNOSIS — Y92410 Unspecified street and highway as the place of occurrence of the external cause: Secondary | ICD-10-CM | POA: Diagnosis not present

## 2024-03-14 LAB — CBC WITH DIFFERENTIAL/PLATELET
Abs Immature Granulocytes: 0.03 10*3/uL (ref 0.00–0.07)
Basophils Absolute: 0 10*3/uL (ref 0.0–0.1)
Basophils Relative: 0 %
Eosinophils Absolute: 0.1 10*3/uL (ref 0.0–0.5)
Eosinophils Relative: 1 %
HCT: 38.8 % (ref 36.0–46.0)
Hemoglobin: 11.6 g/dL — ABNORMAL LOW (ref 12.0–15.0)
Immature Granulocytes: 0 %
Lymphocytes Relative: 11 %
Lymphs Abs: 1.3 10*3/uL (ref 0.7–4.0)
MCH: 21.6 pg — ABNORMAL LOW (ref 26.0–34.0)
MCHC: 29.9 g/dL — ABNORMAL LOW (ref 30.0–36.0)
MCV: 72.1 fL — ABNORMAL LOW (ref 80.0–100.0)
Monocytes Absolute: 0.6 10*3/uL (ref 0.1–1.0)
Monocytes Relative: 6 %
Neutro Abs: 9.6 10*3/uL — ABNORMAL HIGH (ref 1.7–7.7)
Neutrophils Relative %: 82 %
Platelets: 308 10*3/uL (ref 150–400)
RBC: 5.38 MIL/uL — ABNORMAL HIGH (ref 3.87–5.11)
RDW: 18 % — ABNORMAL HIGH (ref 11.5–15.5)
WBC: 11.7 10*3/uL — ABNORMAL HIGH (ref 4.0–10.5)
nRBC: 0 % (ref 0.0–0.2)

## 2024-03-14 LAB — COMPREHENSIVE METABOLIC PANEL WITH GFR
ALT: 34 U/L (ref 0–44)
AST: 47 U/L — ABNORMAL HIGH (ref 15–41)
Albumin: 3.9 g/dL (ref 3.5–5.0)
Alkaline Phosphatase: 49 U/L (ref 38–126)
Anion gap: 10 (ref 5–15)
BUN: 9 mg/dL (ref 6–20)
CO2: 25 mmol/L (ref 22–32)
Calcium: 9.4 mg/dL (ref 8.9–10.3)
Chloride: 102 mmol/L (ref 98–111)
Creatinine, Ser: 0.61 mg/dL (ref 0.44–1.00)
GFR, Estimated: >60 mL/min (ref >60)
Glucose, Bld: 110 mg/dL — ABNORMAL HIGH (ref 70–99)
Potassium: 3.6 mmol/L (ref 3.5–5.1)
Sodium: 137 mmol/L (ref 135–145)
Total Bilirubin: 0.5 mg/dL (ref 0.0–1.2)
Total Protein: 7.9 g/dL (ref 6.5–8.1)

## 2024-03-14 LAB — POC URINE PREG, ED
Preg Test, Ur: NEGATIVE
Preg Test, Ur: NEGATIVE

## 2024-03-14 LAB — TROPONIN I (HIGH SENSITIVITY): Troponin I (High Sensitivity): 3 ng/L (ref <18)

## 2024-03-14 MED ORDER — IOHEXOL 300 MG/ML  SOLN
100.0000 mL | Freq: Once | INTRAMUSCULAR | Status: AC | PRN
  Administered 2024-03-14: 100 mL via INTRAVENOUS
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
## 2024-03-14 NOTE — Discharge Instructions (Addendum)
 xt nghi?m v hnh ?nh c?a b?n r?t ?ng tin c?y. khng c d?u hi?u ch?n th??ng bn trong ho?c gy x??ng. b?n c th? c?m th?y ?au h?n trong vi ngy t?i. thay th? ibuprofen  600 mg v tylenol  650 mg sau m?i 4 gi? n?u c?n ?? gi?m ?au. theo di v?i bc s? ch?m Petronila chnh c?a b?n trong tu?n t?i ?? ?nh gi l?i.
